# Patient Record
Sex: Female | Born: 1935 | Race: White | Hispanic: No
Health system: Southern US, Community
[De-identification: ages and names within clinical notes are randomized; demographics above are authoritative.]

## PROBLEM LIST (undated history)

## (undated) DIAGNOSIS — I10 Essential (primary) hypertension: Secondary | ICD-10-CM

## (undated) DIAGNOSIS — I639 Cerebral infarction, unspecified: Secondary | ICD-10-CM

## (undated) DIAGNOSIS — M8580 Other specified disorders of bone density and structure, unspecified site: Secondary | ICD-10-CM

## (undated) DIAGNOSIS — E7800 Pure hypercholesterolemia, unspecified: Secondary | ICD-10-CM

## (undated) DIAGNOSIS — F0390 Unspecified dementia without behavioral disturbance: Secondary | ICD-10-CM

## (undated) DIAGNOSIS — N189 Chronic kidney disease, unspecified: Secondary | ICD-10-CM

---

## 2003-01-24 ENCOUNTER — Ambulatory Visit (HOSPITAL_COMMUNITY): Admission: RE | Admit: 2003-01-24 | Discharge: 2003-01-24 | Payer: Self-pay | Admitting: Family Medicine

## 2003-06-24 ENCOUNTER — Inpatient Hospital Stay (HOSPITAL_COMMUNITY): Admission: EM | Admit: 2003-06-24 | Discharge: 2003-06-28 | Payer: Self-pay | Admitting: Emergency Medicine

## 2003-11-22 ENCOUNTER — Ambulatory Visit (HOSPITAL_COMMUNITY): Admission: RE | Admit: 2003-11-22 | Discharge: 2003-11-22 | Payer: Self-pay | Admitting: Family Medicine

## 2004-03-17 ENCOUNTER — Inpatient Hospital Stay (HOSPITAL_COMMUNITY): Admission: EM | Admit: 2004-03-17 | Discharge: 2004-03-21 | Payer: Self-pay | Admitting: Emergency Medicine

## 2004-03-18 ENCOUNTER — Ambulatory Visit: Payer: Self-pay | Admitting: *Deleted

## 2004-03-19 ENCOUNTER — Ambulatory Visit: Payer: Self-pay | Admitting: *Deleted

## 2005-01-13 ENCOUNTER — Ambulatory Visit (HOSPITAL_COMMUNITY): Admission: RE | Admit: 2005-01-13 | Discharge: 2005-01-13 | Payer: Self-pay | Admitting: Family Medicine

## 2005-01-14 ENCOUNTER — Observation Stay (HOSPITAL_COMMUNITY): Admission: EM | Admit: 2005-01-14 | Discharge: 2005-01-18 | Payer: Self-pay | Admitting: Emergency Medicine

## 2005-10-29 ENCOUNTER — Inpatient Hospital Stay (HOSPITAL_COMMUNITY): Admission: AD | Admit: 2005-10-29 | Discharge: 2005-11-01 | Payer: Self-pay | Admitting: Family Medicine

## 2007-09-28 ENCOUNTER — Emergency Department (HOSPITAL_COMMUNITY): Admission: EM | Admit: 2007-09-28 | Discharge: 2007-09-28 | Payer: Self-pay | Admitting: Emergency Medicine

## 2009-10-21 ENCOUNTER — Ambulatory Visit (HOSPITAL_COMMUNITY): Admission: RE | Admit: 2009-10-21 | Discharge: 2009-10-21 | Payer: Self-pay | Admitting: Family Medicine

## 2010-07-14 ENCOUNTER — Other Ambulatory Visit (HOSPITAL_COMMUNITY): Payer: Self-pay | Admitting: Family Medicine

## 2010-07-14 ENCOUNTER — Ambulatory Visit (HOSPITAL_COMMUNITY)
Admission: RE | Admit: 2010-07-14 | Discharge: 2010-07-14 | Disposition: A | Discharge status: Home / Self Care | Payer: Medicare Other | Source: Ambulatory Visit | Attending: Family Medicine | Admitting: Family Medicine

## 2010-07-14 DIAGNOSIS — Z9181 History of falling: Secondary | ICD-10-CM | POA: Insufficient documentation

## 2010-07-14 DIAGNOSIS — R531 Weakness: Secondary | ICD-10-CM

## 2010-07-14 DIAGNOSIS — W19XXXA Unspecified fall, initial encounter: Secondary | ICD-10-CM

## 2010-07-14 DIAGNOSIS — R5381 Other malaise: Secondary | ICD-10-CM | POA: Insufficient documentation

## 2010-07-14 DIAGNOSIS — R4182 Altered mental status, unspecified: Secondary | ICD-10-CM | POA: Insufficient documentation

## 2010-07-14 DIAGNOSIS — H7090 Unspecified mastoiditis, unspecified ear: Secondary | ICD-10-CM | POA: Insufficient documentation

## 2010-07-14 DIAGNOSIS — R5383 Other fatigue: Secondary | ICD-10-CM | POA: Insufficient documentation

## 2010-07-14 DIAGNOSIS — J320 Chronic maxillary sinusitis: Secondary | ICD-10-CM | POA: Insufficient documentation

## 2010-07-15 ENCOUNTER — Other Ambulatory Visit (HOSPITAL_COMMUNITY): Payer: Self-pay | Admitting: Family Medicine

## 2010-07-15 DIAGNOSIS — R531 Weakness: Secondary | ICD-10-CM

## 2010-07-15 DIAGNOSIS — R479 Unspecified speech disturbances: Secondary | ICD-10-CM

## 2010-07-16 ENCOUNTER — Ambulatory Visit (HOSPITAL_COMMUNITY)
Admission: RE | Admit: 2010-07-16 | Discharge: 2010-07-16 | Disposition: A | Discharge status: Home / Self Care | Payer: Medicare Other | Source: Ambulatory Visit | Attending: Family Medicine | Admitting: Family Medicine

## 2010-07-16 DIAGNOSIS — R4182 Altered mental status, unspecified: Secondary | ICD-10-CM | POA: Insufficient documentation

## 2010-07-16 DIAGNOSIS — R479 Unspecified speech disturbances: Secondary | ICD-10-CM

## 2010-07-16 DIAGNOSIS — G939 Disorder of brain, unspecified: Secondary | ICD-10-CM | POA: Insufficient documentation

## 2010-07-16 DIAGNOSIS — M6281 Muscle weakness (generalized): Secondary | ICD-10-CM | POA: Insufficient documentation

## 2010-07-16 DIAGNOSIS — R531 Weakness: Secondary | ICD-10-CM

## 2010-07-21 ENCOUNTER — Other Ambulatory Visit (HOSPITAL_COMMUNITY): Payer: Self-pay | Admitting: Family Medicine

## 2010-07-21 DIAGNOSIS — G459 Transient cerebral ischemic attack, unspecified: Secondary | ICD-10-CM

## 2010-07-23 ENCOUNTER — Ambulatory Visit (HOSPITAL_COMMUNITY)
Admission: RE | Admit: 2010-07-23 | Discharge: 2010-07-23 | Disposition: A | Discharge status: Home / Self Care | Payer: Medicare Other | Source: Ambulatory Visit | Attending: Family Medicine | Admitting: Family Medicine

## 2010-07-23 DIAGNOSIS — I359 Nonrheumatic aortic valve disorder, unspecified: Secondary | ICD-10-CM

## 2010-07-23 DIAGNOSIS — G459 Transient cerebral ischemic attack, unspecified: Secondary | ICD-10-CM | POA: Insufficient documentation

## 2010-07-23 DIAGNOSIS — I10 Essential (primary) hypertension: Secondary | ICD-10-CM | POA: Insufficient documentation

## 2010-07-23 DIAGNOSIS — I6529 Occlusion and stenosis of unspecified carotid artery: Secondary | ICD-10-CM | POA: Insufficient documentation

## 2010-08-07 NOTE — H&P (Signed)
[name], [name]                            ACCOUNT NO.:  [id number]   MEDICAL RECORD NO.:  [id number]                  PATIENT TYPE:   LOCATION:                                       FACILITY:   PHYSICIAN:  Angus G. Renard Matter, M.D.              DATE OF BIRTH:   DATE OF ADMISSION:  DATE OF DISCHARGE:                                HISTORY & PHYSICAL   This patient is 77 years of age.  She was admitted to the ED today with  generalized weakness, difficulty standing and walking.  She was evaluated by  the ED physician.  She had been on Ketac for eye infection.   LAB DATA:  CBC WBC 8700, hemoglobin 13.9, hematocrit 40.5.  Chemistry:  Sodium 137, potassium 4.6, chloride 104, CO2 28, glucose 121, BUN 18,  creatinine 1.2, calcium 8.2.  An electrocardiogram was performed which  showed evidence of a right bundle branch block.  T-wave inversion in lead 3,  AVL and AVF, V3-V6.  The patient's cardiac enzymes CPK total 52, CPK/MB 2.3,  troponin 0.03.   It was felt that the patient should be admitted for monitoring to rule out  ischemic heart disease.   SOCIAL HISTORY:  The patient smokes, does not use drugs or alcohol.   FAMILY HISTORY:  Noncontributory.   PAST MEDICAL HISTORY:  The patient has had previous eye infection, previous  GI bleed due to peptic ulcer disease.   ALLERGIES:  No known allergies.   REVIEW OF SYSTEMS:  HEENT:  Negative except for recent eye infection.  CARDIOPULMONARY:  The patient has intermittent anterior chest pain.  GI:  No  bowel irregularity or bleeding.  GU:  No dysuria or hematuria.   PHYSICAL EXAMINATION:  VITAL SIGNS:  Blood pressure 188/66, respirations 24,  pulse 76, temperature 97.6.  HEENT:  Eyes PERRLA.  TMs negative.  Oropharynx benign.  NECK:  Supple.  No JVD or thyroid abnormalities.  LUNGS:  Clear to P&A.  HEART:  Regular rhythm, no murmurs.  ABDOMEN:  No palpable organs or masses.  SKIN:  Warm and dry.  EXTREMITIES:  Free of edema.   DIAGNOSES:  Generalized weakness, urinary tract infection, abnormal EKG.  Rule out ischemic heart disease.    ___________________________________________                                         Ishmael Holter. Renard Matter, M.D.   AGM/MEDQ  D:  06/24/2003  T:  06/24/2003  Job:  [id number]

## 2010-08-07 NOTE — Group Therapy Note (Signed)
[name], [name]                ACCOUNT NO.:  [id number]   MEDICAL RECORD NO.:  [id number]          PATIENT TYPE:  INP   LOCATION:  A216                          FACILITY:  [facility]   PHYSICIAN:  Angus G. Renard Matter, MD   DATE OF BIRTH:  02/15/1927   DATE OF PROCEDURE:  DATE OF DISCHARGE:                                   PROGRESS NOTE   ADDENDUM:   HISTORY:  This patient has bilateral carotid bruits heard on examination.     Angu   AGM/MEDQ  D:  03/18/2004  T:  03/18/2004  Job:  [id number]

## 2010-08-07 NOTE — Consult Note (Signed)
NAME:  [name], [name] NO.:  [id number]   MEDICAL RECORD NO.:  [id number]                   PATIENT TYPE:  INP   LOCATION:  A206                                 FACILITY:  [facility]   PHYSICIAN:  Vida Roller, M.D.                DATE OF BIRTH:  10/18/1926   DATE OF CONSULTATION:  06/25/2003  DATE OF DISCHARGE:                                   CONSULTATION   CARDIOLOGY CONSULTATION   HISTORY OF PRESENT ILLNESS:  [name] is a woman followed by Dr. Butch Penny who presented with unsteadiness and dizziness associated with some  chest discomfort by history, although she states that she really has not had  any discomfort in her chest or shortness of breath.  She states that  sometimes she gets unsteady on her feet.  She is not a very good historian  and appears to have a moderate amount of dementia.  Her daughter is telling  me that she was quite unsteady on her feet yesterday, but much better today.  She denies any PND or orthopnea.  No lower extremity edema.  No visual  changes although she has recently had an infection in her eye which is being  treated with some topical lotion and the question is whether she has those  visual changes from that.  She denies any heat or cold intolerance.  She has  not lost or gained any significant amount of weight.   PAST MEDICAL HISTORY:  Her past medical history is really pretty much  noncontributory.  She has not really been seen by physician's in the past  for any particular thing other than this infection in her eye which has  occurred and she was seen by Dr. Renard Matter a couple of days ago who treated  her for this particular issue. She denies any high blood pressure, diabetes  mellitus or hypercholesterolemia, though she tells me that she has never had  her cholesterol checked.  She does have a history of peptic ulcer disease.  She was treated a number of years ago, and this was thought to be secondary  to  high-dose aspirin therapy though this is not well documented.   SOCIAL HISTORY:  She smokes cigarettes and has for many years, probably has  COPD.  She does not drink alcohol or use any illicit drugs.   FAMILY HISTORY:  Her family history is essentially unobtainable  She does  not recall how old her parents were or what they died of.  She has no known  drug allergies.   REVIEW OF SYSTEMS:  Her review of systems is generally negative except for  that reviewed in her history of present illness.   PHYSICAL EXAMINATION:  VITAL SIGNS:  When she presented to the ER her blood  pressure was 188/66 in her right arm, respiratory rate was 27, her pulse was  76.  She is  currently down to 96/61 with no therapy.  GENERAL:  She is a think white female in no apparent distress.  MENTAL STATUS:  She is alert and oriented x4.  HEENT:  She has relatively impressive exophthalmos.  NECK:  She does have a mild enlarged thyroid to palpation.  She has  relatively significant bilateral carotid bruits; right is louder than the  left.  CHEST:  She has decreased breath sounds throughout her bases.  No rales are  noted.  CARDIAC:  Her cardiac exam is a nondisplaced point of maximal impulse, no  lifts or thrills.  She has a 2/6 holosystolic murmur.  There are no extra  sounds.  ABDOMEN:  Her abdomen is soft, nontender, normoactive bowel sounds.  EXTREMITIES:  Her pulses are diminished in her lower extremities.  She has  bilateral femoral bruits that are relatively soft.  Her upper extremity  pulses are 1+ to 2+, no obvious bruits.  She does not have any clubbing,  cyanosis, or edema in her lower extremities.  NEUROLOGIC:  Her neurologic exam is nonfocal to my exam.   Chest x-ray shows cardiomegaly without any acute process.  Head CT shows no  acute process with chronic white matter changes.   LABORATORIES:  White blood cell count is 8.7, H&H of 14 and 41.  Her  platelet count is 236, sodium of 137, potassium  4.6, chloride 104, bicarb  28, BUN 18, creatinine 1.2, and her blood glucose is 121.  Three sets of  cardiac enzymes are not consistent with acute myocardial infarction.  Her UA  shows many white blood cells but is otherwise unremarkable.   CURRENT MEDICATIONS:  The only thing that she is on is ciprofloxacin 500 mg  q.8h.   ASSESSMENT:  So, we have a woman with dizziness of unknown etiology.  Her CT  scan is not revealing, but she does have bilateral carotid bruits and one  wonders if she potentially could have had a small transient ischemic attack.  She does have chronic white matter changes and it is possible that these  were not appreciated.  She has an abnormal EKG with a right bundle branch  block of unknown etiology.  This may have been a chronic problem, it does  not appear to be acute, but we have no old EKG changes for comparison.  There is a history of chest discomfort which is not well documented in her  history of present illness.  Her cardiac enzymes are negative x3.  There are  no Beta-Natriuretic peptide drawn, however.   PLAN:  So my plan is to get an echocardiogram to assess the left ventricular  systolic function; carotid Dopplers to assess how severe her carotid disease  is.  I think that we should probably check a Beta-Natriuretic peptide ,  thyroid function studies and a D-dimmer.  If all of these come back  unrevealing, then, I think, an Adenosine Cardiolite is probably a reasonable  test to do.  Obviously if she has severe carotid disease we will cancel the  Adenosine Cardiolite and consider other therapy; and if her echocardiogram  shows depressed LV function we can consider a heart catheterization at that  time.  Further decisions are forthcoming.      ___________________________________________  Vida Roller, M.D.  JH/MEDQ  D:  06/25/2003  T:  06/25/2003  Job:  [id number]

## 2010-08-07 NOTE — Group Therapy Note (Signed)
NAMESECRET, KRISTENSEN                ACCOUNT NO.:  [id number]   MEDICAL RECORD NO.:  [id number]          PATIENT TYPE:  INP   LOCATION:  A216                          FACILITY:  [facility]   PHYSICIAN:  Angus G. Renard Matter, MD   DATE OF BIRTH:  04-Jan-1927   DATE OF PROCEDURE:  DATE OF DISCHARGE:                                   PROGRESS NOTE   This patient was admitted with chest pain.  She was seen by cardiology who  ordered echocardiogram.  Her cardiac enzymes have remained negative.  It was  felt that she did not have any evidence of ischemia.   OBJECTIVE:  VITAL SIGNS:  Blood pressure 160/67; respirations 20; pulse 78;  temperature 97.7.  LUNGS:  Clear to P&A.  HEART:  Regular rhythm.   ASSESSMENT:  Patient was admitted with chest pain, hypotension.   PLAN:  Continue current regimen.     Angu   AGM/MEDQ  D:  03/19/2004  T:  03/19/2004  Job:  [id number]

## 2010-08-07 NOTE — Group Therapy Note (Signed)
NAMEZAKKIYYA, BARNO                ACCOUNT NO.:  [id number]   MEDICAL RECORD NO.:  [id number]          PATIENT TYPE:  INP   LOCATION:  A310                          FACILITY:  [facility]   PHYSICIAN:  Angus G. Renard Matter, MD   DATE OF BIRTH:  [birth year]   DATE OF PROCEDURE:  01/17/2005  DATE OF DISCHARGE:                                   PROGRESS NOTE   SUBJECTIVE:  This patient was admitted with dehydration, prerenal azotemia,  metabolic acidosis, hyperkalemia, possible ischemic heart disease.  The  patient does have anemia with hemoglobin 9.9, hematocrit 28.7.  Most recent  potassium was 5.8, sodium 133, serum iron 90, iron binding capacity 213.  UA  negative.   ASSESSMENT:  The patient was admitted with dehydration, prerenal azotemia,  metabolic acidosis and hyperkalemia.  She does have anemia of undetermined  etiology.  Obtained stool for occult blood.      Angus G. Renard Matter, MD  Electronically Signed     AGM/MEDQ  D:  01/17/2005  T:  01/18/2005  Job:  [id number]

## 2010-08-07 NOTE — H&P (Signed)
[name], [name]                ACCOUNT NO.:  [id number]   MEDICAL RECORD NO.:  [id number]          PATIENT TYPE:  INP   LOCATION:  A202                          FACILITY:  [facility]   PHYSICIAN:  Angus G. Renard Matter, MD   DATE OF BIRTH:  1926/07/30   DATE OF ADMISSION:  10/29/2005  DATE OF DISCHARGE:  LH                                HISTORY & PHYSICAL   HISTORY OF PRESENT ILLNESS:  This 77 year old white female was seen in the  office today with the chief complaint of weakness.  The patient had been  seen yesterday in the office for the evaluation.  She has a history of  azotemia, coronary artery disease, chronic kidney disease, carotid occlusive  disease.  The patient had a basic metabolic panel done yesterday which  showed a sodium of 138, potassium 6.5, chloride 110, CO2 of 18, glucose 99,  BUN 37, creatinine 1.4.  The patient was asked to return for a repeat study  on her electrolytes.  These were repeated today and revealed a sodium of  140, potassium 7, chloride 113, BUN 43, creatinine 1.7.  It was felt that  the patient needs to be in the hospital for the treatment of critical  potassium level.  She is admitted as an observation to address this issue  with IV insulin, Kayexalate, etc and closer monitoring.   SOCIAL HISTORY:  The patient was a former cigarette smoker.   FAMILY HISTORY:  Noncontributory.  No known coronary artery disease,  diabetes, etc.   PAST MEDICAL/SURGICAL HISTORY:  1. The patient has a history of having been hospitalized many years ago      for double pneumonia.  2. More recently has had an evaluation by Dr. Salvadore Farber in      [place] for carotid occlusive disease.  The patient has not been      treated surgically for this, but she has a significant carotid      occlusion on the left.  3. The patient has had six pregnancies.  4. A tubal ligation.  5. Dyslipidemia.  6. Known azotemia with a GFR of 28.37, creatinine of 1.5.   ALLERGIES:  No  known drug allergies.   MEDICATIONS:  1. Lovastatin 20 mg daily.  2. Niaspan 500 mg daily.  3. Hydrochlorothiazide 12.5 mg daily.  4. Nitrostat 0.4 mg p.r.n.  5. Enalapril 10 mg daily.  6. Plavix 75 mg daily.  7. Lorazepam 0.5 mg q.4h. p.r.n.  8. Aspirin 81 mg daily.   REVIEW OF SYSTEMS:  HEENT:  Negative.  CARDIOPULMONARY:  No cough,  hemoptysis or dyspnea.  GI:  No bowel irregularity or bleeding.  GENITOURINARY:  No dysuria or hematuria.   PHYSICAL EXAMINATION:  GENERAL:  Alert patient who is extremely hard of  hearing.  VITAL SIGNS:  Blood pressure 120/80, pulse 60, respirations 18, temperature  98 degrees.  HEENT:  Decreased hearing bilaterally.  Eyes:  Pupils equal, round, reactive  to light and accommodation.  The patient has lid lag on both upper lids.  Oropharynx benign.  NECK:  Supple, no jugular  venous distention.  A carotid bruit on the left.  HEART:  A regular rhythm.  No murmurs, no cardiomegaly.  ABDOMEN:  No palpable organs or masses.  GENITALIA:  Normal.  EXTREMITIES:  Free of edema.  NEUROLOGIC:  No focal deficit.   DIAGNOSES:  1. Hyperkalemia.  2. Azotemia.  3. History of coronary artery disease.  4. Dyslipidemia.  5. History of carotid occlusive disease.   PLAN:  To start intravenous fluids and bolus the patient with 50% glucose  and give 10 units of Regular insulin.  The patient will be started on  Kayexalate p.o. and electrolytes will be closely monitored.  The patient  will be placed on telemetry.      Angus G. Renard Matter, MD  Electronically Signed     AGM/MEDQ  D:  10/29/2005  T:  10/29/2005  Job:  [id number]

## 2010-08-07 NOTE — Group Therapy Note (Signed)
[name], [name]                ACCOUNT NO.:  [id number]   MEDICAL RECORD NO.:  [id number]          PATIENT TYPE:  INP   LOCATION:  A310                          FACILITY:  [facility]   PHYSICIAN:  Angus G. Renard Matter, MD   DATE OF BIRTH:  1926-04-24   DATE OF PROCEDURE:  01/16/2005  DATE OF DISCHARGE:                                   PROGRESS NOTE   This patient was admitted with dehydration, prerenal azotemia, metabolic  acidosis, hyperkalemia, possible ischemic heart disease.  Her most recent  electrolyte shows a sodium of 133, potassium 5.8, chloride 112, BUN 40,  creatinine 1.3.  Cardiac enzymes remain normal.  Her hemoglobin had dropped  to 9.9 with hematocrit 28.7.   OBJECTIVE:  VITAL SIGNS:  Blood pressure 102/62, respirations 20, pulse 88,  temp 97.3.  BUN 40, creatinine 1.3.  HEART:  Regular rhythm.  LUNGS:  Clear to P&A.  ABDOMEN:  No palpable organs or masses.   ASSESSMENT:  The patient admitted with dehydration, prerenal azotemia,  metabolic acidosis, hyperkalemia.  She does have anemia of undetermined  etiology.   PLAN:  Obtain stool cultures.  Repeat CBC.  Anemia profile.      Angus G. Renard Matter, MD  Electronically Signed     AGM/MEDQ  D:  01/16/2005  T:  01/16/2005  Job:  [id number]

## 2010-08-07 NOTE — Group Therapy Note (Signed)
[name]                ACCOUNT NO.:  [id number]   MEDICAL RECORD NO.:  [id number]          PATIENT TYPE:  INP   LOCATION:  A310                          FACILITY:  [facility]   PHYSICIAN:  Angus G. Renard Matter, MD   DATE OF BIRTH:  1926-04-25   DATE OF PROCEDURE:  01/15/2005  DATE OF DISCHARGE:                                   PROGRESS NOTE   SUBJECTIVE:  This patient was admitted through the ED with weakness.  She  was felt to have dehydration, prerenal azotemia, hyperkalemia, metabolic  acidosis, possibly secondary to dehydration.   OBJECTIVE:  VITAL SIGNS:  Blood pressure 153/48, pulse 52, respirations 20.  LUNGS:  Clear to P&A.  HEART:  Regular rhythm.  ABDOMEN:  No palpable organs or masses.   ASSESSMENT:  The patient was admitted with dehydration, prerenal azotemia,  hyperkalemia, possible underlying coronary artery disease.  The patient has  known carotid artery stenosis.   PLAN:  Continue current regimen.      Angus G. Renard Matter, MD  Electronically Signed     AGM/MEDQ  D:  01/15/2005  T:  01/15/2005  Job:  [id number]

## 2010-08-07 NOTE — Group Therapy Note (Signed)
[name], [name]                ACCOUNT NO.:  [id number]   MEDICAL RECORD NO.:  [id number]          PATIENT TYPE:  INP   LOCATION:  A202                          FACILITY:  [facility]   PHYSICIAN:  Angus G. Renard Matter, MD   DATE OF BIRTH:  01-May-1926   DATE OF PROCEDURE:  11/01/2005  DATE OF DISCHARGE:  11/01/2005                                   PROGRESS NOTE   This patient returned through the ED with generalized weakness.  She does  have a history of congestive heart failure, cardiomyopathy, coronary artery  disease, COPD, and recent pneumonia.  Apparently started noticing weakness  on her lower extremities which continued to get worse to the point that she  could not walk, although she can move her extremities.   SIGNIFICANT LABORATORY DATA:  Hemoglobin 9.8, hematocrit 29.2.  Chemistries  were normal.   OBJECTIVE:  VITAL SIGNS:  Blood pressure 132/67, respirations 20, pulse 106,  temperature 97.3.  Hemoglobin 9.8, hematocrit 29.2.  HEART:  Regular rhythm.  LUNGS:  Diminished breath sounds.  ABDOMEN:  No palpable organs or masses.  NEUROLOGIC:  Patient has weakness in extremities.   ASSESSMENT:  Patient was admitted with general weakness in her legs.  Does  have history of congestive heart failure, cardiomyopathy, coronary artery  disease, atrial fibrillation, history of breast cancer, history of elevated  BNP.   PLAN:  Proceed with x-ray of head, CT, neurology consult.      Angus G. Renard Matter, MD  Electronically Signed     AGM/MEDQ  D:  11/01/2005  T:  11/01/2005  Job:  [id number]

## 2010-08-07 NOTE — Group Therapy Note (Signed)
[name], [name]                ACCOUNT NO.:  [id number]   MEDICAL RECORD NO.:  [id number]          PATIENT TYPE:  INP   LOCATION:  A216                          FACILITY:  [facility]   PHYSICIAN:  Angus G. Renard Matter, MD   DATE OF BIRTH:  [birth year]   DATE OF PROCEDURE:  DATE OF DISCHARGE:                                   PROGRESS NOTE   This patient was admitted with anterior chest pain, shortness of breath and  hypotension.  Her blood pressure still remains low at 84/50 with pulse rate  of 70.  Patient has had no further chest pain.  Her cardiac enzyme markers  remain normal.  Arterial blood gases remain in normal range.   OBJECTIVE:  VITAL SIGNS:  Blood pressure 84/50; respirations 18; pulse 70.  LUNGS:  Diminished breath sounds.  HEART:  Regular rhythm.  ABDOMEN:  No palpable organs or masses.   ASSESSMENT:  Patient was admitted to the hospital with hypotension,  bradycardia, chest pain.   PLAN:  Obtain cardiology consult, continue current regimen.     Angu   AGM/MEDQ  D:  03/18/2004  T:  03/18/2004  Job:  [id number]

## 2010-08-07 NOTE — Discharge Summary (Signed)
[name], [name]                ACCOUNT NO.:  [id number]   MEDICAL RECORD NO.:  [id number]          PATIENT TYPE:  INP   LOCATION:  A216                          FACILITY:  [facility]   PHYSICIAN:  Angus G. Renard Matter, MD   DATE OF BIRTH:  1926/06/13   DATE OF ADMISSION:  03/17/2004  DATE OF DISCHARGE:  12/31/2005LH                                 DISCHARGE SUMMARY   PATIENT PROFILE:  [age]-year-old white female admitted March 17, 2004, discharged March 21, 2004, 4 days' hospitalization.   DIAGNOSES:  1.  Hypotension.  2.  Bradycardia.  3.  Chest pain.  4.  Possibly underlying peptic ulcer disease.   CONDITION ON DISCHARGE:  Patient's condition stable at the time of her  discharge.   HISTORY OF PRESENT ILLNESS:  [age]-year-old white female was admitted  through the emergency department of the hospital; she presented there with  shortness of breath and anterior chest pain.  The patient does have a  history of chronic obstructive pulmonary disease.  The patient was thought  to be hypotensive with blood pressure of 94/60 and the emergency department  physician was reluctant to send the patient home with this finding; the  patient was therefore admitted.   EXAMINATION:  GENERAL:  Alert female.  VITAL SIGNS:  Blood pressure 94/60, pulse 61, respirations 18.  HEENT:  Decreased vision.  NECK:  Supple.  Bilateral carotid bruits.  LUNGS:  Diminished breath sounds.  HEART:  Regular rhythm, no murmurs.  ABDOMEN:  No palpable organs or masses.  SKIN:  Skin warm and dry.  EXTREMITIES:  Extremities clear of edema.  NEUROLOGICAL:  No focal deficit.   LABORATORY DATA:  Admission CBC:  WBC 6600, hemoglobin 11.7, hematocrit  34.2.  Blood gases on admission:  A pH 7.364 with a PCO2 of 43.1 and PO2 of  121.0.  Chemistries:  Sodium 132, potassium 4.1, chloride 100, CO2 25,  glucose 118, BUN 22, creatinine 1.2, calcium 8.5.  Subsequent chemistries on  March 21, 2004:  Sodium  135, potassium 3.9, chloride 108, CO2 22, glucose  111, BUN 16, creatinine 1, calcium 8.1.  Liver enzymes:  SGOT 22, SGPT 15,  alkaline phosphatase 57.  CK on admission 79, CK-MB 2.5, relative index  0.01; subsequent CK 82, CK-MB 2.4, troponin 0.01.   Chest x-ray:  Chronic interstitial changes, no acute abnormality.   Electrocardiogram:  Normal sinus rhythm with a rate of 80, anterolateral T  wave abnormalities.   Echocardiogram:  Left ventricle of normal size, normal ejection fraction of  55% to 60%, no wall motion abnormalities, sclerotic aortic valve, mild  tricuspid regurgitation.   HOSPITAL COURSE:  The patient, at the time of her admission, was placed on a  low-cholesterol diet, bedrest, nasal O2 at 2 L.  Vital signs were monitored.  The patient was given nitroglycerin 0.4 mg q.5 min. p.r.n.  She was  continued on Plavix 75 mg daily, Lovenox 40 mg subcu daily, Advicor 500/20  mg 1 at bedtime.  The patient was seen by cardiology.  Echocardiogram was  done for LV function and this  was normal, no wall motion abnormalities.  She  was placed on Altace 2.5 mg daily.  The etiology of her chest pain was  unclear and was felt possibly secondary to underlying peptic ulcer disease.  It was felt she did not have any ischemic heart disease of note.  The  patient showed a progressive improvement during her hospital stay and was  able to be discharged on the 4th hospital day.   DISCHARGE MEDICATIONS:  The patient was discharged on:  1.  Plavix 75 mg daily.  2.  Zocor 10 mg daily.  3.  Niaspan 500 mg at h.s.  4.  Altace 2.5 mg daily.  5.  Nitrostat 0.4 mg p.r.n.     Angu   AGM/MEDQ  D:  04/06/2004  T:  04/06/2004  Job:  [id number]

## 2010-08-07 NOTE — H&P (Signed)
[name], [name]                ACCOUNT NO.:  [id number]   MEDICAL RECORD NO.:  [id number]          PATIENT TYPE:  INP   LOCATION:  A310                          FACILITY:  [facility]   PHYSICIAN:  Angus G. Renard Matter, MD   DATE OF BIRTH:  26-Aug-1926   DATE OF ADMISSION:  01/14/2005  DATE OF DISCHARGE:  LH                                HISTORY & PHYSICAL   HISTORY OF PRESENT ILLNESS:  This patient is a 77 year old white female who  presented to the  ED with decreased mental status. Apparently, she had  shaking spell, and according to the family members was not acting like  herself.  She was evaluated by ED physician.   LABORATORY DATA:  CBC:  WBC 9500, hemoglobin 11.3, hematocrit 33%.  Chemistry sodium 144, potassium 5.4, chloride 116, CO2 19, glucose 157, BUN  38, creatinine 1.7, calcium 9.2, total protein 7.2, albumin 4.1, SGOT 30,  SGPT 18, alk-phos 70, bilirubin 0.4.  Urinalysis negative.  ABG:  pH 7.289,  PCO2 30.9, PO2 90, bicarbonate 14.4.  Chest x-ray:  No acute disease.  EKG  normal sinus rhythm, T wave abnormality.  Lead 2 and 3, AVF.  The patient  was felt to be in metabolic acidosis with dehydration, hyperkalemia.  She  was started on intravenous fluids and subsequently was admitted.   FAMILY HISTORY:  See previous record.   SOCIAL HISTORY:  The patient does not smoke or drink alcohol.   PAST MEDICAL HISTORY:  1.  History of carotid occlusive disease.  2.  Decreased mentation.  3.  Dyslipidemia.   MEDICATIONS:  1.  Hydrochlorothiazide 12.5 mg.  2.  Vasotec 10 mg.  3.  Nitroglycerin 0.4 mg p.r.n.  4.  Mevacor 20 mg daily.  5.  Niaspan 500 mg daily.   ALLERGIES:  No known drug allergies.   REVIEW OF SYSTEMS:  HEENT:  Negative.  CARDIOPULMONARY:  No cough,  hemoptysis, dyspnea.  GI:  No bowel irregularity or bleeding.  GU:  No  dysuria or hematuria.   PHYSICAL EXAMINATION:  GENERAL APPEARANCE:  Alert female with blood pressure  158/48, pulse 63, respirations 20,  temperature 97.7.  HEENT:  Eyes:  PERRLA.  TM negative.  Oropharynx benign.  NECK:  Supple.  No JVD or thyroid abnormalities.  LUNGS:  Clear to P&A.  HEART:  Regular rhythm.  ABDOMEN:  No palpable organs or masses.  SKIN:  Warm and dry.  EXTREMITIES:  Free of edema.  NEUROLOGICAL:  No focal deficits.  Cranial nerves intact.   DIAGNOSES:  1.  Dehydration.  2.  Prerenal azotemia.  3.  Metabolic acidosis.  4.  Hyperkalemia.  5.  Possible ischemic heart disease      Angus G. Renard Matter, MD  Electronically Signed     AGM/MEDQ  D:  01/15/2005  T:  01/15/2005  Job:  [id number]

## 2010-08-07 NOTE — H&P (Signed)
[name], [name]                ACCOUNT NO.:  [id number]   MEDICAL RECORD NO.:  [id number]          PATIENT TYPE:  INP   LOCATION:  A216                          FACILITY:  [facility]   PHYSICIAN:  Angus G. Renard Matter, MD   DATE OF BIRTH:  1926-12-15   DATE OF ADMISSION:  03/17/2004  DATE OF DISCHARGE:  LH                                HISTORY & PHYSICAL   HISTORY OF PRESENT ILLNESS:  This 77 year old white female was seen in the  emergency department by emergency department physician. She had presented  there with shortness of breath and anterior chest pain. She was evaluated by  emergency department physician. The patient does have a history of chronic  obstructive pulmonary disease. Cardiac monitors were performed in the  emergency department. We communicated with the emergency department  physician. It was felt that the patient was hypotensive with a blood  pressure of 94/60 and emergency department physician was reluctant to send  the patient home with this finding. The patient was therefore admitted.   LABORATORY DATA:  CBC:  White blood cell count 6,600. Hemoglobin 11.7,  hematocrit 34.2. Chemistry:  Sodium 132, potassium 4.1, chloride 100, CO2  25, glucose 118, BUN 22, creatinine 1.2, calcium 8.5. SGOT 22, SGPT 15,  alkaline phosphatase 57. Cardiac monitors:  CK MB 2.6, troponin 0.05,  myoglobin 117. Subsequent myoglobin 106, CPK MB 1.5, troponin 0.05. BNP was  145.   Chest x-ray showed cardiomegaly, chronic interstitial changes.   Electrocardiogram showed incomplete right bundle branch block. This was  stable electrocardiogram when compared to previous electrocardiogram.   SOCIAL HISTORY:  The patient does not smoke or drink.   FAMILY HISTORY:  Positive for coronary artery disease.   PAST MEDICAL/SURGICAL HISTORY:  The patient has a history of hypertension,  history of coronary artery disease, carotid occlusive disease on the left  without neurological symptoms. There is a  prior history of gastrointestinal  bleed from duodenal ulcer. She has a history of hypertension and  dyslipidemia. She has had chronic hearing loss and decreased vision. Has had  cataract removed from the right eye.   ADMISSION MEDICATIONS:  Plavix 75 mg, Altace 2.5 mg, hydrochlorothiazide  12.5 mg, Advicor 520.   ALLERGIES:  ASPIRIN.   REVIEW OF SYSTEMS:  HEENT:  Negative. CARDIOPULMONARY:  The patient has had  shortness of breath and anterior chest pain, which occurred approximately 2  hours prior to admission. GASTROINTESTINAL:  No nausea, vomiting or  diarrhea. GENITOURINARY:  No dysuria or hematuria.   PHYSICAL EXAMINATION:  GENERAL:  An alert female.  VITAL SIGNS:  Blood pressure 94/60, pulse 61, respiratory rate 18.  HEENT:  Decreased vision. Oropharynx benign.  NECK:  Supple.  LUNGS:  Diminished breath sounds.  HEART:  Regular rhythm. No murmurs.  ABDOMEN:  No palpable organs or masses.  SKIN:  Warm and dry.  EXTREMITIES: No edema.  NEUROLOGIC:  No focal deficits.   DIAGNOSES:  1.  Anterior chest pain of undetermined etiology.  2.  History of chronic obstructive pulmonary disease.  3.  Hypotension.     Angu  AGM/MEDQ  D:  03/17/2004  T:  03/17/2004  Job:  [id number]

## 2010-08-07 NOTE — Group Therapy Note (Signed)
NAMEELLYSE, ROTOLO                ACCOUNT NO.:  [id number]   MEDICAL RECORD NO.:  [id number]          PATIENT TYPE:  INP   LOCATION:  A216                          FACILITY:  [facility]   PHYSICIAN:  Angus G. Renard Matter, MD   DATE OF BIRTH:  20-Mar-1927   DATE OF PROCEDURE:  03/20/2004  DATE OF DISCHARGE:                                   PROGRESS NOTE   SUBJECTIVE:  This patient complains of back pain and chest pain.  She  remains stable otherwise.  She was admitted to the hospital with anterior  chest pain.  Has a history of chronic obstructive pulmonary disease and  hypotension.  Cardiology has done 2-D echo and report is pending.   PHYSICAL EXAMINATION:  VITAL SIGNS:  Blood pressure 110/59, respirations 20,  pulse 77, temperature 98.4.  LUNGS: Clear to percussion and auscultation.  HEART:  Regular rhythm.  ABDOMEN:  No palpable organs or masses.   ASSESSMENT:  This patient admitted with chest pain.  Does have chronic  obstructive pulmonary disease.  Recent hypotension.  Her cardiac enzymes  remain normal.  Plan to continue current regimen.     Angu   AGM/MEDQ  D:  03/20/2004  T:  03/20/2004  Job:  [id number]   cc:   Rhae Lerner. Margretta Ditty, M.D.  [address]  [place]  [place] [address]

## 2010-08-07 NOTE — Group Therapy Note (Signed)
NAME:  [name], [name]                          ACCOUNT NO.:  [id number]   MEDICAL RECORD NO.:  [id number]                   PATIENT TYPE:  INP   LOCATION:  A206                                 FACILITY:  [facility]   PHYSICIAN:  Angus G. Renard Matter, M.D.              DATE OF BIRTH:  October 20, 1926   DATE OF PROCEDURE:  DATE OF DISCHARGE:                                   PROGRESS NOTE   SUBJECTIVE:  This patient was admitted through the emergency department with  abnormal EKG and urinary tract infection.  Her cardiac enzymes showed CPK of  59, CK-MB of 2.7, troponin 0.01.   OBJECTIVE:  Vital signs:  Blood pressure 163/61, respirations 22, pulse 84,  temperature 97.4.  Lungs clear to P&A.  Heart, regular rhythm.  Abdomen:  No  palpable organs with masses.   ASSESSMENT:  The patient have a urinary tract infection.  She has an  abnormal EKG and possible underlying ischemic heart disease.   PLAN:  Continue antibiotics.  Will obtain cardiology consultation today.      ___________________________________________                                            Ishmael Holter. Renard Matter, M.D.   AGM/MEDQ  D:  06/25/2003  T:  06/25/2003  Job:  [id number]

## 2010-08-07 NOTE — Procedures (Signed)
NAMESHAKENYA, STONEBERG                ACCOUNT NO.:  [id number]   MEDICAL RECORD NO.:  [id number]          PATIENT TYPE:  INP   LOCATION:  A216                          FACILITY:  [facility]   PHYSICIAN:  Vida Roller, M.D.   DATE OF BIRTH:  [birth year]   DATE OF PROCEDURE:  03/19/2004  DATE OF DISCHARGE:                                  ECHOCARDIOGRAM   PRIMARY CARE PHYSICIAN:  Angus G. Renard Matter, M.D.   Edwyna Shell:  [id number] and [id number] through [id number].   PROCEDURE:  Echocardiogram.   CARDIOLOGIST:  Vida Roller, M.D.   INDICATIONS FOR PROCEDURE:  This is a 77 year old woman with hypotension.   DESCRIPTION OF PROCEDURE:  The technical quality of the study is adequate.   M-MODE:  The aorta is 26 mm.   The left atrium is 41 mm.   The septum is 17 mm.   The posterior wall is 13 mm.   Left ventricular diastolic dimension is 43 mm.   The left ventricular systolic dimension is 31 mm.   2-D AND DOPPLER IMAGING:  The left ventricle is normal size with mild concentric left ventricular  hypertrophy.  There is a normal ejection fraction at 55%-60%.  There are no  wall motion abnormalities.   The right ventricle is of normal size with mild free-wall hypertrophy.  There are no wall motion abnormalities.   There is biatrial enlargement.   The aortic valve is sclerotic, with evidence of mild aortic stenosis, with a  peak gradient of 27 mmHg and moderate aortic insufficiency.   There is moderate mitral annular calcification with mild mitral  regurgitation.  No mitral stenosis.   There is mild tricuspid regurgitation.   The pericardium has a small anterior fusion, versus a fat pad.  It is  difficult to assess.  There is no evidence of cardiac tamponade physiology.   The inferior vena cava is normal size.   The ascending aorta is not well seen.     Trey Paula   JH/MEDQ  D:  03/19/2004  T:  03/19/2004  Job:  [id number]

## 2010-08-07 NOTE — Consult Note (Signed)
[name], [name]                ACCOUNT NO.:  [id number]   MEDICAL RECORD NO.:  [id number]          PATIENT TYPE:  INP   LOCATION:  A202                          FACILITY:  [facility]   PHYSICIAN:  Jorja Loa, M.D.DATE OF BIRTH:  10-Sep-1926   DATE OF CONSULTATION:  10/29/2005  DATE OF DISCHARGE:                                   CONSULTATION   REASON FOR CONSULTATION:  Hyperkalemia.   HISTORY:  [name] is a 77 year old female with a past medical history of  hypertension, a history also of chronic renal insufficiency.  History of  carotid artery disease.  Presently was brought after she was found to have  elevated potassium. Since the patient has severe hearing problem  __________  for admission was obtained from her daughter.  At this moment there is no  history of diabetes, and also no history of nausea or vomiting.   PAST MEDICAL HISTORY:  She has, as stated above, a history of chronic renal  failure with creatinine around 1.78, history of peptic ulcer disease,  history of hypertension, history of peripheral vascular disease.  Also  history of chronic renal failure, and hypercholesterolemia.  She also has  some previous hyperkalemia according to her brother.   MEDICATIONS:  1. As an outpatient consists of ACE inhibitors.  2. Plavix 75 mg p.o. daily.  3. Niacin 500 mg p.o. q.h.s.  4. Zocor 10 mg p.o. daily.  5. She is getting IV fluid at 30 mL per hour.   ALLERGIES:  She is allergic to ASPIRIN.   SOCIAL HISTORY:  No history of smoking.  No history of alcohol abuse.   REVIEW OF SYSTEMS:  No nausea, no vomiting.  Appetite is reasonable. No  shortness of breath, dizziness, or lightheadedness.  At this moment the  patient does not seem to offer any complaints and no swelling of her legs.   PHYSICAL EXAMINATION:  VITAL SIGNS:  Her temperature is 97.9.  Pulse is 20.  Blood pressure is 140/64.  CHEST:  Clear to auscultation.  HEART EXAM:  Revealed regular rate and rhythm.   No murmur.  ABDOMEN:  Soft, positive bowel sounds.  EXTREMITIES:  No edema.   BLOOD WORK:  Her hemoglobin is 10.7, hematocrit 31.5, white blood cell count  is 8.2, sodium 131, potassium 6.1, BUN 42, creatinine 1.8, calcium is 8.5.   HOSPITAL COURSE BY PROBLEMS:  Problem #1:  HYPERKALEMIA.  At this moment  etiology is not clear, but at this moment, since the patient has some mild  renal failure it seems also she was on ACE inhibitor, probably in  combination of also a high potassium diet.  Her potassium, at this moment,  seems to be somewhat lower.   Problem #2:  HYPERTENSION.  Her blood pressure seemed to be controlled very  well.   Problem #3:  HISTORY OF CAROTID ARTERY DISEASE.  She is on Plavix.   Problem #4:  DYSLIPIDEMIA.  She is on cholesterol reducing medication,  Zocor.   Problem #5:  MILD RENAL INSUFFICIENCY, etiology not clear.  Hypertension,  nephrosclerosis and possible ischemic kidney  disease also needs to be  entertained.   RECOMMENDATIONS:  We will start her on normal saline at 80 mL/h.  We will  give her some Kayexalate and will do an ultrasound of the kidneys.  We will  give her Kayexalate 30 gm 2 doses, will coordinate with other medications  and will follow the patient.      Jorja Loa, M.D.  Electronically Signed     BB/MEDQ  D:  10/29/2005  T:  10/30/2005  Job:  [id number]

## 2010-08-07 NOTE — Consult Note (Signed)
NAMENATHALYA, [name]                ACCOUNT NO.:  [id number]   MEDICAL RECORD NO.:  [id number]          PATIENT TYPE:  INP   LOCATION:  A216                          FACILITY:  [facility]   PHYSICIAN:  Vida Roller, M.D.   DATE OF BIRTH:  [birth year]   DATE OF CONSULTATION:  03/18/2004  DATE OF DISCHARGE:                                   CONSULTATION   HISTORY OF PRESENT ILLNESS:  Ms. Friedli is a 77 year old woman that we saw  back in April of this year for chest discomfort.  She had a negative  ischemic workup and was lost to follow-up.  She does have severe carotid  disease, but felt to be non-obstructive.  Also had an echocardiogram then  which was unrevealing.  She came into the hospital because she was having  difficulty walking.  There is some question of a presyncopal episode.  She  was significantly hypotensive in the ER.  It is unclear whether or not she  had any discomfort in her chest.  It is reported on her history and physical  that she did.  She denies this, but states that she can have the chest pain  almost all the time.  She is a very difficult historian, secondary to  deafness and you essentially have to stand and yell into her right ear for  her to hear you.   MEDICATIONS PRIOR TO ADMISSION:  1.  Plavix 75 mg a day.  2.  Altace 2.5 mg a day.  3.  Hydrochlorothiazide 12.5 mg a day.  4.  Avacor 520 once a day.   In the hospital she is on:  1.  Plavix.  2.  Lovenox 40 mg once a day.  3.  Niacin 500 mg q.h.s.  4.  Zocor 10 mg q.h.s.  5.  IV normal saline.   PAST MEDICAL HISTORY:  1.  Peptic ulcer disease.  2.  Peripheral vascular disease.  3.  Hypertension.   STUDIES:  As previously shown.  She had an adenosine Cardiolite done in  April of this year, which showed no evidence of ischemia or scar and  ejection fraction of 62%.   SOCIAL HISTORY:  She lives in [place].  I think she lives by herself.  It  says she lives with her family, but she tells me she  lives by herself.  She  is a widow.  She has seven children.  She used to smoke, but quit two months  ago; has a 50+ pack-year smoking history.  Does not drink or use illicit  drugs.   FAMILY HISTORY:  Noncontributory.   REVIEW OF SYSTEMS:  Difficult to obtain due to her deafness, but it appears  to be generally negative.   PHYSICAL EXAMINATION:  GENERAL:  She is an elderly, mildly cachectic, white  female in no apparent distress who is alert and oriented times two.  She is  not entirely sure what the date is.  VITAL SIGNS:  She is afebrile; her pulse is 70; her respiratory rate is 12;  and her blood pressure is 100/55.  HEAD, EARS,  EYES, NOSE AND THROAT:  Significant for severe left cataract.  She has bilateral bruits, but no significant jugulovenous distension.  CHEST:  Clear to auscultation bilaterally.  CARDIAC:  Regular.  She has a 2/6 holosystolic murmur.  ABDOMEN:  Soft, nontender.  LOWER EXTREMITIES:  Without significant clubbing, cyanosis or edema.  Her  pulses are intact bilaterally.  MUSCULOSKELETAL:  Without significant deformities.  NEUROLOGIC:  Generally negative, except for the mild confusion.   Chest x-ray shows cardiomegaly with chronic interstitial changes, but no  acute changes, compared to a previous chest x-ray.  Electrocardiogram shows  sinus rhythm, but a rate of 60 with left axis deviation.  PR interval is  normal.  Her QRS duration is mildly prolonged at 120 ms.  QT interval is  normal.  She has an incomplete right bundle branch block with inverted T  waves.  This is unchanged from her previous EKG from April of this year.  Sodium 132, potassium 4.1, chloride of 100, bicarbonate 25, BUN 22,  creatinine 1.2 and her blood sugar is 118.  White blood cell count 6.6, H&H  of 11 and 34 with a platelet count of 288.  She is allergic to ASPIRIN.  Her  cardiac enzymes times three are negative.  D-Dimer is 0.75 and her B-type  natriuretic peptide is 145.   So this  is a woman with hypotension, which I think is probably failure to  thrive.  She may be over-medicated.  She may be dehydrated.  It is difficult  to know.  There are no orthostatic vital signs, but she certainly came in at  60/30 when she was evaluated initially in the ER.  This chest pain history  is difficult to obtain.  She has had a relative thorough workup in the past  without any evidence of significant ischemia and the peptic ulcer disease  may be the cause of this and may need to be evaluated.  Certainly, the  anemia needs to be looked at.  The peripheral vascular disease, I think at  this point, is likely to be stable.  Overall, I think probably the best  thing to do is to continue to orally rehydrate her, hold her hypertensives,  I think we will check an echocardiogram to make sure.  Her cardiac enzymes  have been negative times three and she may benefit from placement in a  nursing facility, as it appears that she is not entirely able to care for  herself at home.  This I will leave to her primary care Sitlaly Gudiel.     Trey Paula   JH/MEDQ  D:  03/18/2004  T:  03/18/2004  Job:  [id number]

## 2010-08-07 NOTE — Discharge Summary (Signed)
NAMELIYANA, SUNIGA                ACCOUNT NO.:  [id number]   MEDICAL RECORD NO.:  [id number]          PATIENT TYPE:  INP   LOCATION:  A202                          FACILITY:  [facility]   PHYSICIAN:  Angus G. Renard Matter, MD   DATE OF BIRTH:  Nov 16, 1926   DATE OF ADMISSION:  10/29/2005  DATE OF DISCHARGE:  08/13/2007LH                                 DISCHARGE SUMMARY   A 77 year old female admitted October 29, 2005 and discharged November 01, 2005  for three days hospitalization.   DIAGNOSES:  1. Hyperkalemia.  2. Azotemia.  3. History of coronary artery disease.  4. Dyslipidemia.  5. History of carotid occlusive disease.   CONDITION ON DISCHARGE:  Condition improved at the time of her discharge.   This 77 year old white female was seen in the office on the day of admission  with chief complaint of weakness. She had been seen one day prior for  evaluation. She does have a history of azotemia, coronary artery disease,  chronic kidney disease, carotid occlusive disease. The patient had basic  metabolic panel done which showed sodium 138, potassium 6.5, chloride 110,  CO2 18, glucose 99, BUN 37, creatinine 1.4. The patient was asked to return  and repeat sodium and electrolytes. They were reviewed and revealed sodium  140, potassium 7, chloride 113, BUN 43, creatinine 1.7. It was felt the  patient needed to be in the hospital with critical potassium level. She was  admitted as an observation patient to address issues with IV insulin,  Kayexalate, etc., and closer monitoring.   PHYSICAL EXAMINATION:  GENERAL:  The patient alert, extremely hard of  hearing.  VITAL SIGNS:  Blood pressure 120/80, pulse 60, respiration 18, temperature  98.  HEENT: The patient has decreased hearing bilaterally. Eyes:  Pupils round,  reactive and equal to light and accommodation. The patient has lid lag on  both upper lids. Oropharynx benign.  NECK:  Supple. No JVD, carotid bruit was heard on the left.  HEART:   Regular rhythm. No murmurs, no cardiomegaly.  ABDOMEN:  No palpable organs or masses.  GENITALIA:  Normal.  EXTREMITIES:  Free of edema.  NEUROLOGIC:  No focal deficits.   LABORATORY DATA:  CBC on admission: WBC 8200 with hemoglobin 10.7,  hematocrit 31.5. Chemistries:  Sodium 139 potassium 6.1, chloride 114, CO2  21, glucose 100, BUN 42, creatinine 1.8. Calcium 8.5. Total protein 6.5.  Albumin 3.8. Subsequent chemistries on November 01, 2005:  Sodium 144,  potassium 4.2, chloride 115, CO2 22, glucose 101, BUN 29, creatinine 1.3.  Calcium 8.1. Liver enzymes, SGOT 26, SGPT 18. Alkaline phosphatase 66,  bilirubin 0.6. Magnesium 2. Iron level 49, iron saturation 16.   Renal and urinary tract ultrasound showed small kidneys with mild cortical  thinning, no hydronephrosis. Electrocardiogram:  Normal sinus rhythm,  borderline ST elevation.   HOSPITAL COURSE:  The patient at the time of admission placed on 1/2 saline  KVO rate. She was given 50% glucose 50 cc as a bolus followed by 10 units of  Regular insulin. Her electrolytes were monitored. We asked that she  be seen  by nephrology as a consult. She is continued on her home meds. Lovastatin 20  mg daily, Niaspan 500 mg daily, Plavix 75 mg daily. The patient's potassium  level was improved. She did have a renal ultrasound which showed small  kidneys but no hydronephrosis. She was started by __________  on Lasix 60 mg  b.i.d. Foley catheter was inserted. Her Lasix subsequently was changed to 60  mg daily. The patient did improve, became more alert. There is some question  whether or not the patient's ACE inhibitor may have contributed to her  hyperkalemia. This was held during her hospitalization. Her electrolytes  returned to normal, and she was able to discharged on the third hospital  day. She was sent home on Lovastatin 20 mg daily, Niaspan 500 mg daily, HCTZ  12.5 mg daily, p.r.n. Nitrostat. Enalapril was held. Plavix 75 mg daily,   aspirin 81 mg daily, lorazepam 0.5 mg p.r.n.      Angus G. Renard Matter, MD  Electronically Signed     AGM/MEDQ  D:  11/16/2005  T:  11/16/2005  Job:  [id number]

## 2010-08-07 NOTE — Group Therapy Note (Signed)
[name], [name]                ACCOUNT NO.:  [id number]   MEDICAL RECORD NO.:  [id number]          PATIENT TYPE:  INP   LOCATION:  A202                          FACILITY:  [facility]   PHYSICIAN:  Angus G. Renard Matter, MD   DATE OF BIRTH:  Mar 13, 1927   DATE OF PROCEDURE:  10/30/2005  DATE OF DISCHARGE:                                   PROGRESS NOTE   PROGRESS NOTE - October 30, 2005.   SUBJECTIVE:  This patient was admitted because of elevated potassium  associated with azotemia.  The patient has received Kayexalate and is  feeling much better.  She was having weakness, generalized at the time of  her admission.  Most recent BMET shows sodium 145, potassium 5.1, chloride  114, CO2 22, glucose 96, BUN 44, creatinine 1.8.  Magnesium was 2.0.   OBJECTIVE:  VITAL SIGNS:  Blood pressure 140/58, respirations 21, pulse 86,  temperature 97.4.  HEART:  Regular rhythm.  LUNGS:  Clear.  ABDOMEN: No palpable organs or masses.   ASSESSMENT:  Patient has renal insufficiency, hyperkalemia which has  improved.   PLAN:  To continue to monitor her electrolytes.  Ultrasound has been ordered  of the kidneys.  Foley catheter inserted.  She received Kayexalate 30 grams.  She has been seen by nephrology.      Angus G. Renard Matter, MD  Electronically Signed     AGM/MEDQ  D:  10/30/2005  T:  10/30/2005  Job:  [id number]

## 2010-08-07 NOTE — Procedures (Signed)
NAME:  [name], [name]                          ACCOUNT NO.:  [id number]   MEDICAL RECORD NO.:  [id number]                   PATIENT TYPE:  INP   LOCATION:  A206                                 FACILITY:  [facility]   PHYSICIAN:  Vida Roller, M.D.                DATE OF BIRTH:  1926-11-25   DATE OF PROCEDURE:  DATE OF DISCHARGE:                                  ECHOCARDIOGRAM   TAPE NUMBER:  LB [id number], tape count 3075 through 3689.   INDICATION:  This is a woman with dizziness and abnormal EKG.  No previous  echocardiograms.   TECHNICAL QUALITY:  Adequate.   M-MODE TRACINGS:  1. The aorta is 27 mm.  2. The left atrium is 39 mm.  3. The septum is 18 mm.  4. The posterior wall is 13 mm.  5. The left ventricular diastolic dimension is 42 mm.  6. The left ventricular systolic dimension is 29 mm.   2-D AND DOPPLER IMAGING:  1. The left ventricle is normal size with normal systolic function.  There     is moderate left ventricular hypertrophy with predominance in the septum     consistent with the patient's advanced age.  There appears to be a small     left ventricular outflow gradient which is unclear as to whether or not     it is from the upper septal hypertrophy or from the valve, but it is only     about 17 mmHg.  2. The estimated ejection fraction is 60-65%.  There is a mild left     ventricular relaxation abnormality consistent with the patient's age.  3. The right ventricle is normal size with normal systolic function.  4. Both atria are normal size.  There is no obvious atrial septal defect.  5. There is mild insufficiency of the aortic valve.  The left ventricular     outflow gradient has previously been discussed.  6. The mitral valve is morphologically unremarkable with mild annular     calcification.  There is trace to mild insufficiency.  7. The tricuspid valve has mild insufficiency.  8. The pulmonic valve is not well seen.  9. There is a small echo-free space in the  pericardium which is probably     pericardial fat versus and insignificant pericardial effusion.  10.      The inferior vena cava is normal size.  11.      The ascending aorta appears to have mild atherosclerosis.  The     aortic arch also has significant atherosclerosis and was not well seen.      ___________________________________________                                            Vida Roller, M.D.   JH/MEDQ  D:  06/25/2003  T:  06/26/2003  Job:  [id number]

## 2010-08-07 NOTE — Group Therapy Note (Signed)
[name], [name]                ACCOUNT NO.:  [id number]   MEDICAL RECORD NO.:  [id number]          PATIENT TYPE:  INP   LOCATION:  A202                          FACILITY:  [facility]   PHYSICIAN:  Angus G. Renard Matter, MD   DATE OF BIRTH:  03-May-1926   DATE OF PROCEDURE:  DATE OF DISCHARGE:                                   PROGRESS NOTE   This patient was admitted because of elevated potassium associated with  azotemia.  She has received Kayexalate and continues to feel better.  Her  current lab studies show sodium 142, potassium 5.1, chloride 113, CO2 24,  glucose 102, BUN 41 and creatinine 2.  The patient had an ultrasound of  kidney's which did not show evidence of hydronephrosis.   OBJECTIVE:  VITAL SIGNS:  Blood pressure 138/70, respiration 20, pulse 79,  temperature 97.9.  LUNGS:  Clear.  HEART:  Regular rhythm.  ABDOMEN:  No palpable organs or masses but she has had some diarrhea  intermittently.   ASSESSMENT:  Patient has chronic renal failure, history of hypertension,  hyperkalemia which has improved.   PLAN:  To continue current regimen.  Will continue to follow the patient's  electrolytes, BUN, creatinine and potassium.      Angus G. Renard Matter, MD  Electronically Signed     AGM/MEDQ  D:  10/31/2005  T:  11/01/2005  Job:  [id number]

## 2010-08-07 NOTE — Discharge Summary (Signed)
NAME:  [name], [name]                          ACCOUNT NO.:  [id number]   MEDICAL RECORD NO.:  [id number]                   PATIENT TYPE:  INP   LOCATION:  A206                                 FACILITY:  [facility]   PHYSICIAN:  Angus G. Renard Matter, M.D.              DATE OF BIRTH:  [birth year]   DATE OF ADMISSION:  06/24/2003  DATE OF DISCHARGE:  06/28/2003                                 DISCHARGE SUMMARY   DIAGNOSES:  1. Carotid occlusive disease.  2. Right bundle branch block.  3. Urinary tract infection.  4. Hypertension.   CONDITION ON DISCHARGE:  Stable and improved.   HISTORY OF PRESENT ILLNESS:  This patient was admitted through the ED with  generalized weakness, difficulty standing and walking.  She was evaluated by  ED physician.  She was put on __________for eye infection.  She was admitted  to rule out ischemic heart disease.   PHYSICAL EXAMINATION:  VITAL SIGNS:  Blood pressure 188/66, respirations 24,  pulse 76, temperature 97.6.  HEENT:  Eyes PERRLA.  TMs negative.  Oropharynx benign.  NECK:  Supple.  No JVD or thyroid abnormalities.  LUNGS:  Clear to P&A.  HEART:  Regular rate and rhythm.  No murmurs.  ABDOMEN:  No palpable organs or masses.  SKIN:  Warm and dry.  EXTREMITIES:  Free of edema.   LABORATORY DATA:  Admission CBC:  WBC 8700, hemoglobin 13.9, hematocrit  40.5.  Chemistry:  Sodium 137, potassium 4.6, chloride 104, CO2 28, glucose  121, BUN 18, creatinine 1.2, calcium 8.2.  Subsequent chemistries on June 27, 2003:  Sodium 139, potassium 4.3, chloride 105, CO2 28, glucose 105, BUN  20, creatinine 1.2, calcium 8.7.  CK on admission 52,  CK MB 2.3, troponin 0.03.  Subsequent enzymes 4505, CK 114, CK MB 4.1,  troponin 0.02.  Urinalysis 11-20 WBC's.  Urine culture no growth.  CT of  head:  Mild generalized atrophy.  Mild changes of small vessel disease of  white matter.  Chest x-ray:  Cardiac enlargement and chronic interstitial  changes.  No acute findings.   Carotid Duplex ultrasound, extensive bilateral  carotid plaque formation, producing 60-69% stenosis of the right internal  carotid artery.  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  Dobutamine stress Cardiolite study.  No  convincing evidence of myocardial ischemia or infarction.  Echocardiogram:  Ejection fraction 60-65%.  Moderate left ventricular hypertrophy.   HOSPITAL COURSE:  The patient, at the time of her admission, was placed on  nasal O2 at 2 liters.  Cardiac enzymes were monitored.  Vital signs were  monitored.  She was given a sublingual nitroglycerin p.r.n. pain.  She was  placed on Cipro 500 mg b.i.d.  Urine was cultured.  The patient was  subsequently seen by Cardiology.  Adenosine Cardiolite Study was scheduled.  Did not show any evidence of myocardial  ischemia.  Also, carotid Doppler  ultrasound was ordered and showed evidence of 60-69% stenosis of the right  internal carotid artery and right subclavian steel.  The patient was placed  on Plavix 75 mg daily.  Altace 5 mg daily.  Subsequently,  hydrochlorothiazide  12.5 mg daily.  The patient showed progressive  improvement throughout her hospital stay and was able to be discharged after  four days hospitalization.  The patient was discharged on Plavix 75 mg  daily, Altace 2.5 mg daily, Cipro 500 mg daily and hydrochlorothiazide 12.5  mg daily.     ___________________________________________                                         Ishmael Holter Renard Matter, M.D.   AGM/MEDQ  D:  07/22/2003  T:  07/22/2003  Job:  [id number]

## 2010-08-07 NOTE — Group Therapy Note (Signed)
NAME:  [name], TALBOT                          ACCOUNT NO.:  [id number]   MEDICAL RECORD NO.:  [id number]                   PATIENT TYPE:  INP   LOCATION:  A206                                 FACILITY:  [facility]   PHYSICIAN:  Angus G. Renard Matter, M.D.              DATE OF BIRTH:  [birth year]   DATE OF PROCEDURE:  DATE OF DISCHARGE:                                   PROGRESS NOTE   SUBJECTIVE:  This patient was admitted with a complaint of weakness.  She  did have urinary tract infection which is being treated.  She has been seen  by cardiology, and Cardiolite dobutamine study has been done.  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   OBJECTIVE:  Vital signs:  Blood pressure 94/48, respirations 20, pulse 76,  temperature 97.8.  Heart regular rhythm.  Lungs clear to P&A.  Abdomen, no  palpable organs or masses.   ASSESSMENT:  The patient was admitted to the hospital following an episode  of weakness.  She did have an abnormal EKG.  She has chronic right bundle  branch block, and carotid artery occlusive disease.  She is being evaluated  for myocardial ischemia.   PLAN:  Continue regimen.  We will discuss the situation with cardiology  concerning possibility of discharge.  The patient will need vascular surgery  consultation.      ___________________________________________                                            Ishmael Holter Renard Matter, M.D.   AGM/MEDQ  D:  06/28/2003  T:  06/28/2003  Job:  [id number]

## 2010-08-07 NOTE — Group Therapy Note (Signed)
NAME:  [name], [name]                          ACCOUNT NO.:  [id number]   MEDICAL RECORD NO.:  [id number]                   PATIENT TYPE:  INP   LOCATION:  A206                                 FACILITY:  [facility]   PHYSICIAN:  Angus G. Renard Matter, M.D.              DATE OF BIRTH:  1926/05/14   DATE OF PROCEDURE:  DATE OF DISCHARGE:                                   PROGRESS NOTE   SUBJECTIVE:  This patient was admitted with the complaint of weakness.  She  had a urinary tract infection which is being treated.  She was seen by  cardiology, and Cardiolite dobutamine study has been ordered.  She did have  a carotid Doppler ultrasound which showed extensive right carotid plaque  causing 60 to 69% stenosis in the right internal carotid artery and 91 to  99% stenosis of the left internal carotid artery with subclavian steal,  right subclavian artery.   OBJECTIVE:  Vital signs:  Blood pressure 118/67, respirations 18, pulse 77,  temperature 97.7.  Lungs are clear to P&A.  Heart is regular rhythm.  Abdomen, no palpable organs or masses.   ASSESSMENT:  The patient was admitted to the hospital following an episode  of weakness.  She did have an abnormal EKG, and apparently has carotid  artery occlusive disease.  She is being studied of myocardial ischemia.  Will need vascular consultation.   PLAN:  Continue current regimen.      ___________________________________________                                            Ishmael Holter. Renard Matter, M.D.   AGM/MEDQ  D:  06/26/2003  T:  06/26/2003  Job:  [id number]

## 2010-08-07 NOTE — Group Therapy Note (Signed)
[name], [name]                            ACCOUNT NO.:  [id number]   MEDICAL RECORD NO.:  [id number]                  PATIENT TYPE:   LOCATION:                                       FACILITY:   PHYSICIAN:  Angus G. Renard Matter, M.D.              DATE OF BIRTH:   DATE OF PROCEDURE:  06/27/2003  DATE OF DISCHARGE:                                   PROGRESS NOTE   This patient is scheduled for a dobutamine stress test today.  She did have  elevation of blood pressure yesterday at 190/60.  Altace 5 mg was given.   OBJECTIVE:  VITAL SIGNS:  Blood pressure 146/64, respirations 20, pulse 70,  temperature 97.4.  LUNGS:  Clear to P&A. HEART:  Regular rhythm.  ABDOMEN:  No palpable organs or masses.   ASSESSMENT:  The patient was admitted with urinary tract infection,  weakness, has possible underlying ischemic heart disease and has carotid  occlusive disease.   PLAN:  To proceed with dobutamine Cardiolite study and evaluation of  peripheral vascular disease.      ___________________________________________                                            Ishmael Holter. Renard Matter, M.D.   AGM/MEDQ  D:  06/27/2003  T:  06/27/2003  Job:  [id number]

## 2010-12-17 LAB — COMPREHENSIVE METABOLIC PANEL
ALT: 16
AST: 25
Albumin: 3.6
Alkaline Phosphatase: 72
BUN: 26 — ABNORMAL HIGH
CO2: 29
Calcium: 8.9
Chloride: 108
Creatinine, Ser: 1.37 — ABNORMAL HIGH
GFR calc Af Amer: 45 — ABNORMAL LOW
GFR calc non Af Amer: 37 — ABNORMAL LOW
Glucose, Bld: 154 — ABNORMAL HIGH
Potassium: 5
Sodium: 142
Total Bilirubin: 0.6
Total Protein: 6.6

## 2010-12-17 LAB — CBC
HCT: 35 — ABNORMAL LOW
Hemoglobin: 11.6 — ABNORMAL LOW
MCHC: 33.2
MCV: 97
Platelets: 213
RBC: 3.61 — ABNORMAL LOW
RDW: 12.8
WBC: 8.2

## 2010-12-17 LAB — URINALYSIS, ROUTINE W REFLEX MICROSCOPIC
Bilirubin Urine: NEGATIVE
Glucose, UA: NEGATIVE
Hgb urine dipstick: NEGATIVE
Ketones, ur: NEGATIVE
Nitrite: NEGATIVE
Protein, ur: NEGATIVE
Specific Gravity, Urine: 1.015
Urobilinogen, UA: 0.2
pH: 5.5

## 2010-12-17 LAB — DIFFERENTIAL
Eosinophils Absolute: 0.1
Eosinophils Relative: 1
Lymphs Abs: 1.5
Monocytes Absolute: 0.6
Monocytes Relative: 7

## 2011-01-08 ENCOUNTER — Other Ambulatory Visit (HOSPITAL_COMMUNITY): Payer: Self-pay | Admitting: Family Medicine

## 2011-01-08 DIAGNOSIS — I999 Unspecified disorder of circulatory system: Secondary | ICD-10-CM

## 2011-01-08 DIAGNOSIS — I10 Essential (primary) hypertension: Secondary | ICD-10-CM

## 2011-01-08 DIAGNOSIS — E785 Hyperlipidemia, unspecified: Secondary | ICD-10-CM

## 2011-01-12 ENCOUNTER — Ambulatory Visit (HOSPITAL_COMMUNITY)
Admission: RE | Admit: 2011-01-12 | Discharge: 2011-01-12 | Disposition: A | Discharge status: Home / Self Care | Payer: Medicare Other | Source: Ambulatory Visit | Attending: Family Medicine | Admitting: Family Medicine

## 2011-01-12 DIAGNOSIS — E785 Hyperlipidemia, unspecified: Secondary | ICD-10-CM

## 2011-01-12 DIAGNOSIS — I10 Essential (primary) hypertension: Secondary | ICD-10-CM | POA: Insufficient documentation

## 2011-01-12 DIAGNOSIS — I999 Unspecified disorder of circulatory system: Secondary | ICD-10-CM

## 2011-01-12 DIAGNOSIS — I6529 Occlusion and stenosis of unspecified carotid artery: Secondary | ICD-10-CM | POA: Insufficient documentation

## 2011-01-12 DIAGNOSIS — R109 Unspecified abdominal pain: Secondary | ICD-10-CM | POA: Insufficient documentation

## 2011-01-12 DIAGNOSIS — I7090 Unspecified atherosclerosis: Secondary | ICD-10-CM | POA: Insufficient documentation

## 2012-02-04 ENCOUNTER — Ambulatory Visit (HOSPITAL_COMMUNITY)
Admission: RE | Admit: 2012-02-04 | Discharge: 2012-02-04 | Disposition: A | Discharge status: Home / Self Care | Payer: Medicare Other | Source: Ambulatory Visit | Attending: Family Medicine | Admitting: Family Medicine

## 2012-02-04 ENCOUNTER — Other Ambulatory Visit (HOSPITAL_COMMUNITY): Payer: Self-pay | Admitting: Family Medicine

## 2012-02-04 DIAGNOSIS — R262 Difficulty in walking, not elsewhere classified: Secondary | ICD-10-CM

## 2012-02-04 DIAGNOSIS — I70209 Unspecified atherosclerosis of native arteries of extremities, unspecified extremity: Secondary | ICD-10-CM | POA: Insufficient documentation

## 2012-11-22 ENCOUNTER — Ambulatory Visit (HOSPITAL_COMMUNITY)
Admission: RE | Admit: 2012-11-22 | Discharge: 2012-11-22 | Disposition: A | Discharge status: Home / Self Care | Payer: Medicare Other | Source: Ambulatory Visit | Attending: Family Medicine | Admitting: Family Medicine

## 2012-11-22 ENCOUNTER — Other Ambulatory Visit (HOSPITAL_COMMUNITY): Payer: Self-pay | Admitting: Family Medicine

## 2012-11-22 DIAGNOSIS — R52 Pain, unspecified: Secondary | ICD-10-CM

## 2012-11-22 DIAGNOSIS — M25559 Pain in unspecified hip: Secondary | ICD-10-CM | POA: Insufficient documentation

## 2012-11-28 ENCOUNTER — Other Ambulatory Visit (HOSPITAL_COMMUNITY): Payer: Self-pay | Admitting: Family Medicine

## 2012-11-28 DIAGNOSIS — M810 Age-related osteoporosis without current pathological fracture: Secondary | ICD-10-CM

## 2012-12-04 ENCOUNTER — Ambulatory Visit (HOSPITAL_COMMUNITY)
Admission: RE | Admit: 2012-12-04 | Discharge: 2012-12-04 | Disposition: A | Discharge status: Home / Self Care | Payer: Medicare Other | Source: Ambulatory Visit | Attending: Family Medicine | Admitting: Family Medicine

## 2012-12-04 DIAGNOSIS — Z780 Asymptomatic menopausal state: Secondary | ICD-10-CM | POA: Insufficient documentation

## 2012-12-04 DIAGNOSIS — M818 Other osteoporosis without current pathological fracture: Secondary | ICD-10-CM | POA: Insufficient documentation

## 2012-12-04 DIAGNOSIS — M810 Age-related osteoporosis without current pathological fracture: Secondary | ICD-10-CM

## 2013-04-13 ENCOUNTER — Emergency Department (HOSPITAL_COMMUNITY): Payer: Medicare Other

## 2013-04-13 ENCOUNTER — Inpatient Hospital Stay (HOSPITAL_COMMUNITY)
Admission: EM | Admit: 2013-04-13 | Discharge: 2013-04-22 | DRG: 871 | Disposition: E | Payer: Medicare Other | Attending: Pulmonary Disease | Admitting: Pulmonary Disease

## 2013-04-13 ENCOUNTER — Inpatient Hospital Stay (HOSPITAL_COMMUNITY): Payer: Medicare Other

## 2013-04-13 ENCOUNTER — Encounter (HOSPITAL_COMMUNITY): Payer: Self-pay | Admitting: Emergency Medicine

## 2013-04-13 DIAGNOSIS — Z515 Encounter for palliative care: Secondary | ICD-10-CM

## 2013-04-13 DIAGNOSIS — L8992 Pressure ulcer of unspecified site, stage 2: Secondary | ICD-10-CM | POA: Diagnosis present

## 2013-04-13 DIAGNOSIS — E7800 Pure hypercholesterolemia, unspecified: Secondary | ICD-10-CM | POA: Diagnosis present

## 2013-04-13 DIAGNOSIS — N189 Chronic kidney disease, unspecified: Secondary | ICD-10-CM

## 2013-04-13 DIAGNOSIS — E876 Hypokalemia: Secondary | ICD-10-CM

## 2013-04-13 DIAGNOSIS — R7309 Other abnormal glucose: Secondary | ICD-10-CM | POA: Diagnosis present

## 2013-04-13 DIAGNOSIS — I6529 Occlusion and stenosis of unspecified carotid artery: Secondary | ICD-10-CM | POA: Diagnosis present

## 2013-04-13 DIAGNOSIS — N179 Acute kidney failure, unspecified: Secondary | ICD-10-CM

## 2013-04-13 DIAGNOSIS — L89309 Pressure ulcer of unspecified buttock, unspecified stage: Secondary | ICD-10-CM | POA: Diagnosis present

## 2013-04-13 DIAGNOSIS — D649 Anemia, unspecified: Secondary | ICD-10-CM | POA: Diagnosis present

## 2013-04-13 DIAGNOSIS — L8990 Pressure ulcer of unspecified site, unspecified stage: Secondary | ICD-10-CM

## 2013-04-13 DIAGNOSIS — R4182 Altered mental status, unspecified: Secondary | ICD-10-CM

## 2013-04-13 DIAGNOSIS — J9600 Acute respiratory failure, unspecified whether with hypoxia or hypercapnia: Secondary | ICD-10-CM

## 2013-04-13 DIAGNOSIS — R0902 Hypoxemia: Secondary | ICD-10-CM

## 2013-04-13 DIAGNOSIS — I129 Hypertensive chronic kidney disease with stage 1 through stage 4 chronic kidney disease, or unspecified chronic kidney disease: Secondary | ICD-10-CM | POA: Diagnosis present

## 2013-04-13 DIAGNOSIS — R739 Hyperglycemia, unspecified: Secondary | ICD-10-CM | POA: Diagnosis present

## 2013-04-13 DIAGNOSIS — Z8673 Personal history of transient ischemic attack (TIA), and cerebral infarction without residual deficits: Secondary | ICD-10-CM

## 2013-04-13 DIAGNOSIS — I214 Non-ST elevation (NSTEMI) myocardial infarction: Secondary | ICD-10-CM

## 2013-04-13 DIAGNOSIS — R6520 Severe sepsis without septic shock: Secondary | ICD-10-CM

## 2013-04-13 DIAGNOSIS — R6521 Severe sepsis with septic shock: Secondary | ICD-10-CM

## 2013-04-13 DIAGNOSIS — J90 Pleural effusion, not elsewhere classified: Secondary | ICD-10-CM

## 2013-04-13 DIAGNOSIS — A419 Sepsis, unspecified organism: Principal | ICD-10-CM | POA: Diagnosis present

## 2013-04-13 DIAGNOSIS — L8994 Pressure ulcer of unspecified site, stage 4: Secondary | ICD-10-CM | POA: Diagnosis present

## 2013-04-13 DIAGNOSIS — Z66 Do not resuscitate: Secondary | ICD-10-CM | POA: Diagnosis present

## 2013-04-13 DIAGNOSIS — L97109 Non-pressure chronic ulcer of unspecified thigh with unspecified severity: Secondary | ICD-10-CM | POA: Diagnosis present

## 2013-04-13 DIAGNOSIS — I5041 Acute combined systolic (congestive) and diastolic (congestive) heart failure: Secondary | ICD-10-CM

## 2013-04-13 DIAGNOSIS — K7200 Acute and subacute hepatic failure without coma: Secondary | ICD-10-CM | POA: Diagnosis present

## 2013-04-13 DIAGNOSIS — F0390 Unspecified dementia without behavioral disturbance: Secondary | ICD-10-CM | POA: Diagnosis present

## 2013-04-13 DIAGNOSIS — L89899 Pressure ulcer of other site, unspecified stage: Secondary | ICD-10-CM | POA: Diagnosis present

## 2013-04-13 DIAGNOSIS — E870 Hyperosmolality and hypernatremia: Secondary | ICD-10-CM

## 2013-04-13 DIAGNOSIS — J9690 Respiratory failure, unspecified, unspecified whether with hypoxia or hypercapnia: Secondary | ICD-10-CM | POA: Diagnosis present

## 2013-04-13 HISTORY — DX: Pure hypercholesterolemia, unspecified: E78.00

## 2013-04-13 HISTORY — DX: Unspecified dementia, unspecified severity, without behavioral disturbance, psychotic disturbance, mood disturbance, and anxiety: F03.90

## 2013-04-13 HISTORY — DX: Other specified disorders of bone density and structure, unspecified site: M85.80

## 2013-04-13 HISTORY — DX: Chronic kidney disease, unspecified: N18.9

## 2013-04-13 HISTORY — DX: Cerebral infarction, unspecified: I63.9

## 2013-04-13 HISTORY — DX: Essential (primary) hypertension: I10

## 2013-04-13 LAB — URINALYSIS, ROUTINE W REFLEX MICROSCOPIC
BILIRUBIN URINE: NEGATIVE
GLUCOSE, UA: NEGATIVE mg/dL
Hgb urine dipstick: NEGATIVE
Ketones, ur: NEGATIVE mg/dL
Leukocytes, UA: NEGATIVE
NITRITE: NEGATIVE
PH: 5 (ref 5.0–8.0)
Protein, ur: 100 mg/dL — AB
Urobilinogen, UA: 0.2 mg/dL (ref 0.0–1.0)

## 2013-04-13 LAB — BLOOD GAS, ARTERIAL
ACID-BASE EXCESS: 6.9 mmol/L — AB (ref 0.0–2.0)
Acid-Base Excess: 7.2 mmol/L — ABNORMAL HIGH (ref 0.0–2.0)
BICARBONATE: 32 meq/L — AB (ref 20.0–24.0)
Bicarbonate: 31.7 mEq/L — ABNORMAL HIGH (ref 20.0–24.0)
DELIVERY SYSTEMS: POSITIVE
Delivery systems: POSITIVE
Drawn by: 22223
Expiratory PAP: 6
Expiratory PAP: 6
FIO2: 100 %
FIO2: 100 %
INSPIRATORY PAP: 12
Inspiratory PAP: 12
LHR: 10 {breaths}/min
O2 SAT: 93.6 %
O2 Saturation: 95.4 %
PATIENT TEMPERATURE: 37
PATIENT TEMPERATURE: 37
PH ART: 7.406 (ref 7.350–7.450)
PO2 ART: 83.2 mmHg (ref 80.0–100.0)
TCO2: 29.5 mmol/L (ref 0–100)
TCO2: 29.7 mmol/L (ref 0–100)
pCO2 arterial: 52.7 mmHg — ABNORMAL HIGH (ref 35.0–45.0)
pCO2 arterial: 52 mmHg — ABNORMAL HIGH (ref 35.0–45.0)
pH, Arterial: 7.396 (ref 7.350–7.450)
pO2, Arterial: 75.2 mmHg — ABNORMAL LOW (ref 80.0–100.0)

## 2013-04-13 LAB — CBC WITH DIFFERENTIAL/PLATELET
Basophils Absolute: 0 10*3/uL (ref 0.0–0.1)
Basophils Relative: 0 % (ref 0–1)
EOS ABS: 0 10*3/uL (ref 0.0–0.7)
EOS PCT: 0 % (ref 0–5)
HCT: 32.9 % — ABNORMAL LOW (ref 36.0–46.0)
HEMOGLOBIN: 10.2 g/dL — AB (ref 12.0–15.0)
LYMPHS ABS: 0.5 10*3/uL — AB (ref 0.7–4.0)
Lymphocytes Relative: 4 % — ABNORMAL LOW (ref 12–46)
MCH: 32.8 pg (ref 26.0–34.0)
MCHC: 31 g/dL (ref 30.0–36.0)
MCV: 105.8 fL — AB (ref 78.0–100.0)
MONO ABS: 0.7 10*3/uL (ref 0.1–1.0)
MONOS PCT: 6 % (ref 3–12)
Neutro Abs: 10.3 10*3/uL — ABNORMAL HIGH (ref 1.7–7.7)
Neutrophils Relative %: 90 % — ABNORMAL HIGH (ref 43–77)
Platelets: 177 10*3/uL (ref 150–400)
RBC: 3.11 MIL/uL — AB (ref 3.87–5.11)
RDW: 18.8 % — ABNORMAL HIGH (ref 11.5–15.5)
WBC: 11.5 10*3/uL — ABNORMAL HIGH (ref 4.0–10.5)

## 2013-04-13 LAB — COMPREHENSIVE METABOLIC PANEL
ALBUMIN: 2.6 g/dL — AB (ref 3.5–5.2)
ALK PHOS: 107 U/L (ref 39–117)
ALT: 289 U/L — ABNORMAL HIGH (ref 0–35)
AST: 589 U/L — AB (ref 0–37)
BILIRUBIN TOTAL: 0.5 mg/dL (ref 0.3–1.2)
BUN: 45 mg/dL — AB (ref 6–23)
CHLORIDE: 102 meq/L (ref 96–112)
CO2: 32 mEq/L (ref 19–32)
Calcium: 7.8 mg/dL — ABNORMAL LOW (ref 8.4–10.5)
Creatinine, Ser: 1.82 mg/dL — ABNORMAL HIGH (ref 0.50–1.10)
GFR calc Af Amer: 28 mL/min — ABNORMAL LOW (ref >90)
GFR calc non Af Amer: 24 mL/min — ABNORMAL LOW (ref >90)
Glucose, Bld: 154 mg/dL — ABNORMAL HIGH (ref 70–99)
POTASSIUM: 2.8 meq/L — AB (ref 3.7–5.3)
Sodium: 148 mEq/L — ABNORMAL HIGH (ref 137–147)
Total Protein: 6.1 g/dL (ref 6.0–8.3)

## 2013-04-13 LAB — URINE MICROSCOPIC-ADD ON

## 2013-04-13 LAB — GLUCOSE, CAPILLARY: Glucose-Capillary: 139 mg/dL — ABNORMAL HIGH (ref 70–99)

## 2013-04-13 LAB — TROPONIN I
TROPONIN I: 0.68 ng/mL — AB (ref <0.30)
TROPONIN I: 1.17 ng/mL — AB (ref <0.30)

## 2013-04-13 LAB — LIPASE, BLOOD: LIPASE: 17 U/L (ref 11–59)

## 2013-04-13 LAB — LACTIC ACID, PLASMA: LACTIC ACID, VENOUS: 1.3 mmol/L (ref 0.5–2.2)

## 2013-04-13 LAB — PROTIME-INR
INR: 1.34 (ref 0.00–1.49)
Prothrombin Time: 16.3 seconds — ABNORMAL HIGH (ref 11.6–15.2)

## 2013-04-13 LAB — PRO B NATRIURETIC PEPTIDE: Pro B Natriuretic peptide (BNP): 31248 pg/mL — ABNORMAL HIGH (ref 0–450)

## 2013-04-13 MED ORDER — FENTANYL CITRATE 0.05 MG/ML IJ SOLN
INTRAMUSCULAR | Status: AC
  Filled 2013-04-13: qty 50

## 2013-04-13 MED ORDER — NOREPINEPHRINE BITARTRATE 1 MG/ML IJ SOLN
2.0000 ug/min | INTRAMUSCULAR | Status: DC
  Administered 2013-04-14: 10 ug/min via INTRAVENOUS
  Administered 2013-04-14: 18.8 ug/min via INTRAVENOUS
  Filled 2013-04-13 (×2): qty 4

## 2013-04-13 MED ORDER — MIDAZOLAM HCL 2 MG/2ML IJ SOLN
2.0000 mg | INTRAMUSCULAR | Status: DC | PRN
  Administered 2013-04-13 – 2013-04-14 (×2): 2 mg via INTRAVENOUS
  Filled 2013-04-13 (×2): qty 2

## 2013-04-13 MED ORDER — HEPARIN (PORCINE) IN NACL 100-0.45 UNIT/ML-% IJ SOLN: 550.0000 [IU]/h | INTRAMUSCULAR | Status: DC

## 2013-04-13 MED ORDER — MIDAZOLAM HCL 2 MG/2ML IJ SOLN
INTRAMUSCULAR | Status: AC
  Administered 2013-04-14: 2 mg via INTRAVENOUS
  Filled 2013-04-13: qty 2

## 2013-04-13 MED ORDER — HEPARIN BOLUS VIA INFUSION: 2000.0000 [IU] | Freq: Once | INTRAVENOUS | Status: DC

## 2013-04-13 MED ORDER — SODIUM CHLORIDE 0.9 % IV SOLN
25.0000 ug/h | INTRAVENOUS | Status: DC
  Administered 2013-04-13: 25 ug/h via INTRAVENOUS
  Filled 2013-04-13 (×2): qty 50

## 2013-04-13 MED ORDER — POTASSIUM CHLORIDE 10 MEQ/100ML IV SOLN
10.0000 meq | Freq: Once | INTRAVENOUS | Status: AC
  Administered 2013-04-13: 10 meq via INTRAVENOUS
  Filled 2013-04-13: qty 100

## 2013-04-13 MED ORDER — NOREPINEPHRINE BITARTRATE 1 MG/ML IJ SOLN
INTRAMUSCULAR | Status: AC
  Filled 2013-04-13: qty 4

## 2013-04-13 MED ORDER — HEPARIN (PORCINE) IN NACL 100-0.45 UNIT/ML-% IJ SOLN
550.0000 [IU]/h | INTRAMUSCULAR | Status: DC
  Administered 2013-04-13: 550 [IU]/h via INTRAVENOUS

## 2013-04-13 MED ORDER — ETOMIDATE 2 MG/ML IV SOLN
INTRAVENOUS | Status: AC
  Administered 2013-04-13: 20 mg via INTRAVENOUS
  Filled 2013-04-13: qty 20

## 2013-04-13 MED ORDER — FUROSEMIDE 10 MG/ML IJ SOLN
40.0000 mg | Freq: Once | INTRAMUSCULAR | Status: AC
  Administered 2013-04-13: 40 mg via INTRAVENOUS
  Filled 2013-04-13: qty 4

## 2013-04-13 MED ORDER — SODIUM CHLORIDE 0.9 % IV SOLN: INTRAVENOUS | Status: DC

## 2013-04-13 MED ORDER — HEPARIN (PORCINE) IN NACL 100-0.45 UNIT/ML-% IJ SOLN
14.0000 [IU]/kg/h | Freq: Once | INTRAMUSCULAR | Status: DC
  Filled 2013-04-13: qty 250

## 2013-04-13 MED ORDER — HEPARIN SODIUM (PORCINE) 5000 UNIT/ML IJ SOLN
4000.0000 [IU] | Freq: Once | INTRAMUSCULAR | Status: AC
  Administered 2013-04-13: 4000 [IU] via INTRAVENOUS
  Filled 2013-04-13: qty 1

## 2013-04-13 MED ORDER — SUCCINYLCHOLINE CHLORIDE 20 MG/ML IJ SOLN
INTRAMUSCULAR | Status: AC
  Administered 2013-04-13: 100 mg via INTRAVENOUS
  Filled 2013-04-13: qty 1

## 2013-04-13 MED ORDER — SODIUM CHLORIDE 0.9 % IV BOLUS (SEPSIS)
250.0000 mL | Freq: Once | INTRAVENOUS | Status: AC
  Administered 2013-04-13: 250 mL via INTRAVENOUS

## 2013-04-13 MED ORDER — SODIUM CHLORIDE 0.9 % IV SOLN
INTRAVENOUS | Status: AC
  Administered 2013-04-14: 20 mL/h via INTRAVENOUS

## 2013-04-13 MED ORDER — LIDOCAINE HCL (CARDIAC) 20 MG/ML IV SOLN
INTRAVENOUS | Status: AC
  Filled 2013-04-13: qty 5

## 2013-04-13 MED ORDER — ROCURONIUM BROMIDE 50 MG/5ML IV SOLN
INTRAVENOUS | Status: AC
  Filled 2013-04-13: qty 2

## 2013-04-13 NOTE — ED Notes (Signed)
Pt given a , bolus.

## 2013-04-13 NOTE — ED Notes (Signed)
Pt family repots fever and sob today.

## 2013-04-13 NOTE — ED Provider Notes (Addendum)
CSN: [id number]     Arrival date & time 04/04/2013  1258 History   First MD Initiated Contact with Patient 04/01/2013 1346     Chief Complaint  Patient presents with  . Shortness of Breath   (Consider location/radiation/quality/duration/timing/severity/associated sxs/prior Treatment) The history is provided by a relative and the EMS personnel.   80 year old female brought in [facility] EMS family members called patient was noted to be less active today not wanting D. does seem to have fever and shortness of breath. Patient has been bedridden for 3 months. Patient is not a DO NOT RESUSCITATE. Discussed with family they would prefer acute interventions but does not want any long-term interventions. Patient arrived hypoxic on her percent nonrebreather sats below 90%. Also tachypnea. Respiratory rate around 32. Blood pressure systolic was good at 102. Patient has a history of dementia not able to verbalize much here. Patient in distress.  Past Medical History  Diagnosis Date  . Dementia   . Renal disorder   . Hypertension   . High cholesterol    History reviewed. No pertinent past surgical history. No family history on file. History  Substance Use Topics  . Smoking status: Unknown If Ever Smoked  . Smokeless tobacco: Not on file  . Alcohol Use: No   OB History   Grav Para Term Preterm Abortions TAB SAB Ect Mult Living                 Review of Systems  Unable to perform ROS Constitutional: Positive for fever.  Respiratory: Positive for shortness of breath.     Allergies  Review of patient's allergies indicates no known allergies.  Home Medications   Current Outpatient Rx  Name  Route  Sig  Dispense  Refill  . alendronate (FOSAMAX) 70 MG tablet   Oral   Take 70 mg by mouth once a week. Take with a full glass of water on an empty stomach on Saturday         . calcium-vitamin D (OSCAL) 250-125 MG-UNIT per tablet   Oral   Take 1 tablet by mouth 2 (two) times daily.         .  Cholecalciferol (VITAMIN D-3 PO)   Oral   Take 1 tablet by mouth 2 (two) times daily.         . clopidogrel (PLAVIX) 75 MG tablet   Oral   Take 75 mg by mouth daily with breakfast.         . LORazepam (ATIVAN) 1 MG tablet   Oral   Take 1 mg by mouth every 4 (four) hours as needed for anxiety.         . lovastatin (MEVACOR) 40 MG tablet   Oral   Take 40 mg by mouth at bedtime.         . niacin 500 MG tablet   Oral   Take 500 mg by mouth daily.         . sodium polystyrene (KAYEXALATE) 15 GM/60ML suspension   Oral   Take 15 g by mouth 2 (two) times a week. On Mondays and Thursdays         . vitamin E 400 UNIT capsule   Oral   Take 400 Units by mouth 2 (two) times daily.         Marland Kitchen zolpidem (AMBIEN) 5 MG tablet   Oral   Take 5 mg by mouth at bedtime as needed for sleep.  BP 104/60  Pulse 73  Temp(Src) 99.6 F (37.6 C) (Rectal)  Resp 14  Ht 5\' 2"  (1.575 m)  Wt 100 lb (45.36 kg)  BMI 18.29 kg/m2  SpO2 99% Physical Exam  Nursing note and vitals reviewed. Constitutional: She appears well-developed and well-nourished. She appears distressed.  HENT:  Head: Normocephalic and atraumatic.  Mucous membranes dry  Eyes:  Sclerae erythematous no discharge. Patient's left eye remains open at all times. Cornea is very dried out apparently that's baseline as per family.  Neck: Neck supple.  Cardiovascular: Normal rate, regular rhythm and normal heart sounds.   Pulmonary/Chest: She is in respiratory distress. She has no wheezes. She has rales.  Abdominal: Soft. There is no tenderness.  Musculoskeletal: Normal range of motion. She exhibits edema.  Patient is edematous and not extremities right lower extremity more so than left. Patient with some bruising.   Pre-existing known decubitus on the left buttocks hip area.  Neurological:  Patient awake not buried talkative having respiratory difficulty. Is moving all 4 extremities.  Skin: Skin is warm. No rash  noted.    ED Course  INTUBATION Date/Time: 04/07/2013 10:21 PM Performed by: Shelda JakesZACKOWSKI, Milbert Bixler W. Authorized by: Shelda JakesZACKOWSKI, Yuridiana Formanek W. Consent: Verbal consent not obtained. written consent not obtained. The procedure was performed in an emergent situation. Indications: respiratory distress and hypercapnia Intubation method: lighted stylet Patient status: paralyzed (RSI) Preoxygenation: BVM Sedatives: etomidate Paralytic: succinylcholine Laryngoscope size: Mac 3 Tube size: 7.0 mm Tube type: cuffed Number of attempts: 1 Cricoid pressure: no Cords visualized: yes Post-procedure assessment: chest rise and CO2 detector Breath sounds: equal Cuff inflated: yes ETT to lip: 18 cm Tube secured with: ETT holder Patient tolerance: Patient tolerated the procedure well with no immediate complications.   (including critical care time) Labs Review Labs Reviewed  BLOOD GAS, ARTERIAL - Abnormal; Notable for the following:    pCO2 arterial 52.7 (*)    pO2, Arterial 75.2 (*)    Bicarbonate 31.7 (*)    Acid-Base Excess 6.9 (*)    Allens test (pass/fail) NOT INDICATED (*)    All other components within normal limits  PRO B NATRIURETIC PEPTIDE - Abnormal; Notable for the following:    Pro B Natriuretic peptide (BNP) 31248.0 (*)    All other components within normal limits  TROPONIN I - Abnormal; Notable for the following:    Troponin I 0.68 (*)    All other components within normal limits  URINALYSIS, ROUTINE W REFLEX MICROSCOPIC - Abnormal; Notable for the following:    Specific Gravity, Urine >1.030 (*)    Protein, ur 100 (*)    All other components within normal limits  COMPREHENSIVE METABOLIC PANEL - Abnormal; Notable for the following:    Sodium 148 (*)    Potassium 2.8 (*)    Glucose, Bld 154 (*)    BUN 45 (*)    Creatinine, Ser 1.82 (*)    Calcium 7.8 (*)    Albumin 2.6 (*)    AST 589 (*)    ALT 289 (*)    GFR calc non Af Amer 24 (*)    GFR calc Af Amer 28 (*)    All other  components within normal limits  PROTIME-INR - Abnormal; Notable for the following:    Prothrombin Time 16.3 (*)    All other components within normal limits  CBC WITH DIFFERENTIAL - Abnormal; Notable for the following:    WBC 11.5 (*)    RBC 3.11 (*)    Hemoglobin  10.2 (*)    HCT 32.9 (*)    MCV 105.8 (*)    RDW 18.8 (*)    Neutrophils Relative % 90 (*)    Neutro Abs 10.3 (*)    Lymphocytes Relative 4 (*)    Lymphs Abs 0.5 (*)    All other components within normal limits  GLUCOSE, CAPILLARY - Abnormal; Notable for the following:    Glucose-Capillary 139 (*)    All other components within normal limits  URINE MICROSCOPIC-ADD ON - Abnormal; Notable for the following:    Squamous Epithelial / LPF MANY (*)    Bacteria, UA MANY (*)    Casts HYALINE CASTS (*)    All other components within normal limits  TROPONIN I - Abnormal; Notable for the following:    Troponin I 1.17 (*)    All other components within normal limits  CULTURE, BLOOD (ROUTINE X 2)  CULTURE, BLOOD (ROUTINE X 2)  URINE CULTURE  LACTIC ACID, PLASMA  LIPASE, BLOOD   Results for orders placed during the hospital encounter of 03/29/2013  CULTURE, BLOOD (ROUTINE X 2)      Result Value Range   Specimen Description LEFT ANTECUBITAL     Special Requests AEB 4CC     Culture PENDING     Report Status PENDING    CULTURE, BLOOD (ROUTINE X 2)      Result Value Range   Specimen Description ARTERIAL BLOOD     Special Requests BAA 2 CC EACH     Culture PENDING     Report Status PENDING    BLOOD GAS, ARTERIAL      Result Value Range   FIO2 100.00     Delivery systems BILEVEL POSITIVE AIRWAY PRESSURE     Rate 10     Inspiratory PAP 12     Expiratory PAP 6     pH, Arterial 7.396  7.350 - 7.450   pCO2 arterial 52.7 (*) 35.0 - 45.0 mmHg   pO2, Arterial 75.2 (*) 80.0 - 100.0 mmHg   Bicarbonate 31.7 (*) 20.0 - 24.0 mEq/L   TCO2 29.5  0 - 100 mmol/L   Acid-Base Excess 6.9 (*) 0.0 - 2.0 mmol/L   O2 Saturation 93.6      Patient temperature 37.0     Collection site BRACHIAL ARTERY     Drawn by COLLECTED BY RT     Sample type ARTERIAL     Allens test (pass/fail) NOT INDICATED (*) PASS  LACTIC ACID, PLASMA      Result Value Range   Lactic Acid, Venous 1.3  0.5 - 2.2 mmol/L  PRO B NATRIURETIC PEPTIDE      Result Value Range   Pro B Natriuretic peptide (BNP) 31248.0 (*) 0 - 450 pg/mL  TROPONIN I      Result Value Range   Troponin I 0.68 (*) <0.30 ng/mL  URINALYSIS, ROUTINE W REFLEX MICROSCOPIC      Result Value Range   Color, Urine YELLOW  YELLOW   APPearance CLEAR  CLEAR   Specific Gravity, Urine >1.030 (*) 1.005 - 1.030   pH 5.0  5.0 - 8.0   Glucose, UA NEGATIVE  NEGATIVE mg/dL   Hgb urine dipstick NEGATIVE  NEGATIVE   Bilirubin Urine NEGATIVE  NEGATIVE   Ketones, ur NEGATIVE  NEGATIVE mg/dL   Protein, ur 161 (*) NEGATIVE mg/dL   Urobilinogen, UA 0.2  0.0 - 1.0 mg/dL   Nitrite NEGATIVE  NEGATIVE   Leukocytes, UA NEGATIVE  NEGATIVE  COMPREHENSIVE METABOLIC PANEL      Result Value Range   Sodium 148 (*) 137 - 147 mEq/L   Potassium 2.8 (*) 3.7 - 5.3 mEq/L   Chloride 102  96 - 112 mEq/L   CO2 32  19 - 32 mEq/L   Glucose, Bld 154 (*) 70 - 99 mg/dL   BUN 45 (*) 6 - 23 mg/dL   Creatinine, Ser 4.09 (*) 0.50 - 1.10 mg/dL   Calcium 7.8 (*) 8.4 - 10.5 mg/dL   Total Protein 6.1  6.0 - 8.3 g/dL   Albumin 2.6 (*) 3.5 - 5.2 g/dL   AST 811 (*) 0 - 37 U/L   ALT 289 (*) 0 - 35 U/L   Alkaline Phosphatase 107  39 - 117 U/L   Total Bilirubin 0.5  0.3 - 1.2 mg/dL   GFR calc non Af Amer 24 (*) >90 mL/min   GFR calc Af Amer 28 (*) >90 mL/min  LIPASE, BLOOD      Result Value Range   Lipase 17  11 - 59 U/L  PROTIME-INR      Result Value Range   Prothrombin Time 16.3 (*) 11.6 - 15.2 seconds   INR 1.34  0.00 - 1.49  CBC WITH DIFFERENTIAL      Result Value Range   WBC 11.5 (*) 4.0 - 10.5 K/uL   RBC 3.11 (*) 3.87 - 5.11 MIL/uL   Hemoglobin 10.2 (*) 12.0 - 15.0 g/dL   HCT 91.4 (*) 78.2 - 95.6 %   MCV 105.8  (*) 78.0 - 100.0 fL   MCH 32.8  26.0 - 34.0 pg   MCHC 31.0  30.0 - 36.0 g/dL   RDW 21.3 (*) 08.6 - 57.8 %   Platelets 177  150 - 400 K/uL   Neutrophils Relative % 90 (*) 43 - 77 %   Neutro Abs 10.3 (*) 1.7 - 7.7 K/uL   Lymphocytes Relative 4 (*) 12 - 46 %   Lymphs Abs 0.5 (*) 0.7 - 4.0 K/uL   Monocytes Relative 6  3 - 12 %   Monocytes Absolute 0.7  0.1 - 1.0 K/uL   Eosinophils Relative 0  0 - 5 %   Eosinophils Absolute 0.0  0.0 - 0.7 K/uL   Basophils Relative 0  0 - 1 %   Basophils Absolute 0.0  0.0 - 0.1 K/uL  GLUCOSE, CAPILLARY      Result Value Range   Glucose-Capillary 139 (*) 70 - 99 mg/dL   Comment 1 Notify RN     Comment 2 Documented in Chart    URINE MICROSCOPIC-ADD ON      Result Value Range   Squamous Epithelial / LPF MANY (*) RARE   WBC, UA 0-2  <3 WBC/hpf   Bacteria, UA MANY (*) RARE   Casts HYALINE CASTS (*) NEGATIVE  TROPONIN I      Result Value Range   Troponin I 1.17 (*) <0.30 ng/mL    Imaging Review Ct Head Wo Contrast  03/28/2013   CLINICAL DATA:  Altered level of consciousness  EXAM: CT HEAD WITHOUT CONTRAST  TECHNIQUE: Contiguous axial images were obtained from the base of the skull through the vertex without intravenous contrast.  COMPARISON:  07/14/2010  FINDINGS: Mild cortical volume loss noted with proportional ventricular prominence. Areas of periventricular white matter hypodensity are most compatible with small vessel ischemic change. No acute hemorrhage, infarct, or mass lesion is identified. No midline shift. Left maxillary sinus air-fluid level is noted. Bilateral maxillary  mucoperiosteal thickening noted. No skull fracture. Orbits are unremarkable with evidence of prior probable right lens extraction.  IMPRESSION: Stable chronic findings as above, no acute intracranial finding.  Sinusitis with left maxillary air-fluid level.   Electronically Signed   By: Christiana Pellant M.D.   On: 04/24/13 19:01   Dg Chest Port 1 View  April 24, 2013   CLINICAL DATA:   Shortness of breath.  EXAM: PORTABLE CHEST - 1 VIEW  COMPARISON:  None.  FINDINGS: Patient is partially rotated to the left. Bilateral pleural effusions are seen, right side greater than left, with associated bibasilar atelectasis. Heart size is prominent. Pulmonary vascular congestion is seen, and mild interstitial edema cannot be excluded.  IMPRESSION: Bilateral pleural effusions and bibasilar atelectasis.  Pulmonary vascular congestion and possible mild interstitial edema.   Electronically Signed   By: Myles Rosenthal M.D.   On: 04-24-13 14:25    EKG Interpretation    Date/Time:  Friday 04-24-13 13:42:16 EST Ventricular Rate:  79 PR Interval:  132 QRS Duration: 90 QT Interval:  360 QTC Calculation: 412 R Axis:   -31 Text Interpretation:  Sinus rhythm with marked sinus arrhythmia with occasional and consecutive Premature ventricular complexes and Fusion complexes Left axis deviation ST \\T \ T wave abnormality, consider inferolateral ischemia Abnormal ECG When compared with ECG of 28-Sep-2007 19:24, Fusion complexes are now Present Premature ventricular complexes are now Present Right bundle branch block is no longer Present Confirmed by Markelle Asaro  MD, Brody Kump ([id number]) on April 24, 2013 2:28:04 PM          CRITICAL CARE Performed by: Shelda Jakes. Total critical care time: 60 Critical care time was exclusive of separately billable procedures and treating other patients. Critical care was necessary to treat or prevent imminent or life-threatening deterioration. Critical care was time spent personally by me on the following activities: development of treatment plan with patient and/or surrogate as well as nursing, discussions with consultants, evaluation of patient's response to treatment, examination of patient, obtaining history from patient or surrogate, ordering and performing treatments and interventions, ordering and review of laboratory studies, ordering and review of radiographic  studies, pulse oximetry and re-evaluation of patient's condition.   MDM   1. Hypoxia   2. Hypernatremia   3. Hypokalemia   4. Pleural effusion, bilateral   5. Decubitus skin ulcer   6. Non-STEMI (non-ST elevated myocardial infarction)    Patient arrived in respiratory distress. Family reported fever and shortness of breath today. Patient with a history of dementia. Patient on her percent nonrebreather on presentation was satting below 90%. Patient started on BiPAP with significant improvement. Chest x-ray shows bilateral pleural effusions with suggestive of some mild perhaps pulmonary edema. Patient was given Lasix 40 mg. Electrolytes came back which showed that she was hypernatremic and hypokalemic. Patient received 10 more likely to potassium IV x2. Patient given a fluid bolus. Patient's systolic blood pressures have been low 100s occasionally a little below 90 but mostly 89-112. Patient's lactic acid was negative not consistent with sepsis. Patient does have a decubitus ulcer of the left buttocks hip area. Mild leukocytosis at 11.5. Patient's initial troponin was elevated repeat troponin was elevated consistent with a non-STEMI basic EKG EKG did show some perhaps inferior lateral ischemia. Change compared to 2009. Patient will require transfer to [facility]. Will initially speak with critical care and cardiology. Patient significantly improved on the BiPAP much more comfortable good color sats are consistently above 90%. Patient does have a marked elevated BNP so certainly  has propensity to pulmonary edema. Suspect that the main problem may very well have been the myocardial infarction. Head CT negative was waiting to get that back before starting heparin since it is negative we'll start the patient on heparin.    Shelda Jakes, MD 2013/04/25 [facility] [place]   Addendum:  On arrival of a [facility] further discussion with critical care the on-call critical care recommended intubation for transport. Patient's  blood gases is same as when the transport was initially arranged. That critical care specialist Dr. Kathrin Penner I did not recommend intubation. However more than willing to accommodate. Patient intubated without difficulties using the glide a scope. In addition [facility] transport team wanted the levo fed IV available that was ordered as well.  See intubation procedure note.  Shelda Jakes, MD 04/25/2013 2223

## 2013-04-13 NOTE — ED Notes (Signed)
Pt is responsive to painful stimuli only, non-verbal.  sats in mid 70s on 2L O2, placed pt on 15L NRB and sats up to upper 80s.  edp at beside for further.

## 2013-04-13 NOTE — ED Notes (Signed)
CRITICAL VALUE ALERT  Critical value received:  K+ 2.8; Troponin 0.68  Date of notification:  04/20/2013  Time of notification:  1338  Critical value read back:yes  Nurse who received alert:  Santiago BurElizabeth Tanza Pellot, RN  MD notified (1st page):    Time of first page:    MD notified (2nd page):  Time of second page:  Responding MD:  Dr. Deretha EmoryZackowski  Time MD responded:  [phone]

## 2013-04-13 NOTE — H&P (Addendum)
PULMONARY  / CRITICAL CARE MEDICINE HISTORY AND PHYSICAL EXAMINATION  Name: [name] MRN: [id number] DOB: [birth year]    ADMISSION DATE:  04/12/2013  CHIEF COMPLAINT:  Acute respiratory failure  BRIEF PATIENT DESCRIPTION: 80 yo F with multiple organ dysfunction possibly secondary to either a primary cardiac event or sepsis who was transferred from [facility]. On transfer goals of care conversation held with primary care giver and decision made not to escalate care and withdraw care in the morning once the family has gathered.    SIGNIFICANT EVENTS / STUDIES:  1. Intubated at [facility] Secondary to Worsening Hypoxemia Apr 25, 2013  LINES / TUBES: 1. PIV  CULTURES: 1. None  ANTIBIOTICS: 1. None  HISTORY OF PRESENT ILLNESS:  [name] is an 80 yo F with dementia, severe carotid stenosis and prior CVAs who was transferred from [facility] after being brought to the ED by her primary care giver, her daughter Nehemiah Massed [phone]), for altered mental status. The following is obtained from my phone conversation with Bonita Quin as the patient is unable to provide any history. Her daughter notes that she has been essentially bedbowned for the last 3 months. Over the last several days she has refused all food and been less communicative. Her daughter notes that her large L thigh ulcer is managed by a home health company.   Her daughter notes that [name] watched one of her children pass away several years ago and notes that her mother mentioned that she would not want to live like that. [name] notes that she knows that her mother's dementia is unlikely to get better and that her multiple medical issues portend a terrible prognosis.  PAST MEDICAL HISTORY :  Past Medical History  Diagnosis Date  . Dementia   . Hypertension   . High cholesterol   . CKD (chronic kidney disease)   . Osteopenia   . CVA (cerebral infarction)     History reviewed. No pertinent past surgical  history.  Prior to Admission medications   Medication Sig Start Date End Date Taking? Authorizing Provider  alendronate (FOSAMAX) 70 MG tablet Take 70 mg by mouth once a week. Take with a full glass of water on an empty stomach on Saturday   Yes Historical Provider, MD  calcium-vitamin D (OSCAL) 250-125 MG-UNIT per tablet Take 1 tablet by mouth 2 (two) times daily.   Yes Historical Provider, MD  Cholecalciferol (VITAMIN D-3 PO) Take 1 tablet by mouth 2 (two) times daily.   Yes Historical Provider, MD  clopidogrel (PLAVIX) 75 MG tablet Take 75 mg by mouth daily with breakfast.   Yes Historical Provider, MD  LORazepam (ATIVAN) 1 MG tablet Take 1 mg by mouth every 4 (four) hours as needed for anxiety.   Yes Historical Provider, MD  lovastatin (MEVACOR) 40 MG tablet Take 40 mg by mouth at bedtime.   Yes Historical Provider, MD  niacin 500 MG tablet Take 500 mg by mouth daily.   Yes Historical Provider, MD  sodium polystyrene (KAYEXALATE) 15 GM/60ML suspension Take 15 g by mouth 2 (two) times a week. On Mondays and Thursdays   Yes Historical Provider, MD  vitamin E 400 UNIT capsule Take 400 Units by mouth 2 (two) times daily.   Yes Historical Provider, MD  zolpidem (AMBIEN) 5 MG tablet Take 5 mg by mouth at bedtime as needed for sleep.   Yes Historical Provider, MD    No Known Allergies  FAMILY HISTORY:  No family  history on file.  SOCIAL HISTORY:  reports that she does not drink alcohol or use illicit drugs. Her tobacco history is not on file.  REVIEW OF SYSTEMS:  Unable to obtain secondary to patient condition.  PHYSICAL EXAM  VITAL SIGNS: Temp:  [98.9 F (37.2 C)-100.3 F (37.9 C)] 98.9 F (37.2 C) (01/23 2142) Pulse Rate:  [29-93] 72 (01/24 0016) Resp:  [14-32] 16 (01/24 0016) BP: (60-138)/(22-113) 91/58 mmHg (01/24 0016) SpO2:  [88 %-99 %] 88 % (01/24 0016) FiO2 (%):  [60 %-100 %] 60 % (01/24 0016) Weight:  [100 lb (45.36 kg)-105 lb 9.6 oz (47.9 kg)] 105 lb 9.6 oz (47.9 kg)  (01/24 0016)  HEMODYNAMICS:    VENTILATOR SETTINGS: Vent Mode:  [-] PRVC FiO2 (%):  [60 %-100 %] 60 % Set Rate:  [16 bmp] 16 bmp Vt Set:  [420 mL] 420 mL PEEP:  [5 cmH20] 5 cmH20 Plateau Pressure:  [14 cmH20] 14 cmH20  INTAKE / OUTPUT: Intake/Output     01/23 0701 - 01/24 0700   I.V. (mL/kg) 40 (0.8)   Total Intake(mL/kg) 40 (0.8)   Urine (mL/kg/hr) 250   Total Output 250   Net -210         PHYSICAL EXAMINATION: General:  Elderly F with profoundly malodorous L thigh ulcer  Neuro:  Unable to assess HEENT:  L corneal scarring, erythemaous eyes, unable to assess mouth Neck:  Trachea supple and midline' no obvious JVD  Cardiovascular:  RRR, NS1/S2, (-) MRG Lungs:  Coarse mechanical breath sounds bilaterally Abdomen:  S/NT/ND/(+)BS Musculoskeletal:  3+ edema to thighs and in RUE to elbow Skin:  Stage 4, 10 cm round pressure ulcer on L thigh, stage 2 decubitous ulcer ~ 2 cm in diameter  LABS:  CBC Recent Labs     03/28/2013  1413  WBC  11.5*  HGB  10.2*  HCT  32.9*  PLT  177    Coag's Recent Labs     04/06/2013  1413  INR  1.34    BMET Recent Labs     04/19/2013  1413  NA  148*  K  2.8*  CL  102  CO2  32  BUN  45*  CREATININE  1.82*  GLUCOSE  154*    Electrolytes Recent Labs     03/30/2013  1413  CALCIUM  7.8*    Sepsis Markers No results found for this basename: LACTICACIDVEN, PROCALCITON, O2SATVEN,  in the last 72 hours  ABG Recent Labs     04/12/2013  1430  04/12/2013  2055  PHART  7.396  7.406  PCO2ART  52.7*  52.0*  PO2ART  75.2*  83.2    Liver Enzymes Recent Labs     04/04/2013  1413  AST  589*  ALT  289*  ALKPHOS  107  BILITOT  0.5  ALBUMIN  2.6*    Cardiac Enzymes Recent Labs     04/20/2013  1413  04/16/2013  1825  TROPONINI  0.68*  1.17*  PROBNP  31248.0*   --     Glucose Recent Labs     04/09/2013  1348  GLUCAP  139*    Imaging Ct Head Wo Contrast  03/28/2013   CLINICAL DATA:  Altered level of consciousness  EXAM: CT  HEAD WITHOUT CONTRAST  TECHNIQUE: Contiguous axial images were obtained from the base of the skull through the vertex without intravenous contrast.  COMPARISON:  07/14/2010  FINDINGS: Mild cortical volume loss noted with proportional ventricular prominence. Areas of  periventricular white matter hypodensity are most compatible with small vessel ischemic change. No acute hemorrhage, infarct, or mass lesion is identified. No midline shift. Left maxillary sinus air-fluid level is noted. Bilateral maxillary mucoperiosteal thickening noted. No skull fracture. Orbits are unremarkable with evidence of prior probable right lens extraction.  IMPRESSION: Stable chronic findings as above, no acute intracranial finding.  Sinusitis with left maxillary air-fluid level.   Electronically Signed   By: Christiana Pellant M.D.   On: 04/02/2013 19:01   Dg Chest Portable 1 View     CLINICAL DATA:  Endotracheal tube placement.  EXAM: PORTABLE CHEST - 1 VIEW  COMPARISON:  Chest radiograph performed earlier today at 2:18 p.m.  FINDINGS: The patient's endotracheal tube is seen ending 3 cm above the carina.  A moderate right-sided pleural effusion is noted, perhaps mildly increased from the prior study. A small left pleural effusion is seen. Underlying vascular congestion is noted. Increased interstitial markings may reflect mild interstitial edema. No pneumothorax is seen.  The cardiomediastinal silhouette is borderline enlarged. No acute osseous abnormalities are identified.  IMPRESSION: 1. Endotracheal tube seen ending 3 cm above the carina. 2. Moderate right-sided pleural effusion may be mildly increased from the prior study. Small left pleural effusion seen. Underlying vascular congestion and borderline cardiomegaly noted. Increased interstitial markings may reflect interstitial edema.   Electronically Signed   By: Roanna Raider M.D.   On: 04/05/2013 22:57   Dg Chest Port 1 View  04/16/2013   CLINICAL DATA:  Shortness of  breath.  EXAM: PORTABLE CHEST - 1 VIEW  COMPARISON:  None.  FINDINGS: Patient is partially rotated to the left. Bilateral pleural effusions are seen, right side greater than left, with associated bibasilar atelectasis. Heart size is prominent. Pulmonary vascular congestion is seen, and mild interstitial edema cannot be excluded.  IMPRESSION: Bilateral pleural effusions and bibasilar atelectasis.  Pulmonary vascular congestion and possible mild interstitial edema.   Electronically Signed   By: Myles Rosenthal M.D.   On: 03/25/2013 14:25   EKG: Personally reviewed. Lateral TWI, PVC. CXR: Personally reviewed. R effusion. Edema. Cardiomegaly.  ASSESSMENT / PLAN: Principal Problem:   Septic shock Active Problems:   Non-STEMI (non-ST elevated myocardial infarction)   Shock liver   Acute combined systolic and diastolic congestive heart failure   Acute respiratory failure   Acute on chronic renal failure   Hypokalemia   Hypernatremia   Pressure ulcer stage IV   Altered mental status   Anemia   Hyperglycemia  1. Goals of Care: [name] daughter, Nehemiah Massed [phone]), indicated that her mother would not want to aggressive management give the multiple organ dysfunction and terrible underlying dementia. As such, we decided to together that the best course of action was make to not stop the interventions keeping her alive for now, specifically mechanical ventilation and pressors, but that we will not titrate any of the therapies and that no labs will be drawn nor will central lines be placed. We will instead focus on her comfort. [name] will be DNR. The family will gather in the morning and they will withdraw care in the morning. Nurse Melina Fiddler confirmed the content of my conversation with [name].   TODAY'S SUMMARY:   I have personally obtained a history, examined the patient, evaluated laboratory and imaging results, formulated the assessment and plan and placed orders.  CRITICAL CARE:  The patient is critically ill with multiple organ systems failure and requires high complexity decision making for assessment  and support, frequent evaluation and titration of therapies, application of advanced monitoring technologies and extensive interpretation of multiple databases. Critical Care Time devoted to patient care services described in this note is 60 minutes.   Evalyn Casco, MD Pulmonary and Critical Care Medicine [facility] Pager: [phone]  04/22/2013, 12:41 AM

## 2013-04-13 NOTE — Progress Notes (Signed)
ANTICOAGULATION CONSULT NOTE - Initial Consult  Pharmacy Consult for Heparin Indication: chest pain/ACS  No Known Allergies  Patient Measurements: Height: 5\' 2"  (157.5 cm) Weight: 100 lb (45.36 kg) IBW/kg (Calculated) : 50.1 Heparin Dosing Weight: 45.4 kg  Vital Signs: Temp: 99.6 F (37.6 C) (01/23 1900) Temp src: Rectal (01/23 1249) BP: 104/60 mmHg (01/23 1900) Pulse Rate: 73 (01/23 1900)  Labs:  Recent Labs  04/08/2013 1413 03/28/2013 1825  HGB 10.2*  --   HCT 32.9*  --   PLT 177  --   LABPROT 16.3*  --   INR 1.34  --   CREATININE 1.82*  --   TROPONINI 0.68* 1.17*    Estimated Creatinine Clearance: 16.5 ml/min (by C-G formula based on Cr of 1.82).   Medical History: Past Medical History  Diagnosis Date  . Dementia   . Renal disorder   . Hypertension   . High cholesterol     Medications:  Scheduled:    Assessment: Patient arrived in respiratory distress Heparin for ACS/Stemi Heparin 4000 units bolus given in ED already Heparin 650 units/hour started in ED already Reviewed labs and PTA medications Patient to be transferred to [facility]  Goal of Therapy:  Heparin level 0.3-0.7 units/ml Monitor platelets by anticoagulation protocol: Yes   Plan:  Reduced heparin rate to 550 units/hour (12 units/kg/hour) Unfractionated heparin level in 8 hours and daily Labs per protocol  Raquel JamesPittman, Lindsay Straka Bennett 03/26/2013,7:26 PM

## 2013-04-14 ENCOUNTER — Encounter (HOSPITAL_COMMUNITY): Payer: Self-pay | Admitting: Internal Medicine

## 2013-04-14 DIAGNOSIS — J9600 Acute respiratory failure, unspecified whether with hypoxia or hypercapnia: Secondary | ICD-10-CM

## 2013-04-14 DIAGNOSIS — N189 Chronic kidney disease, unspecified: Secondary | ICD-10-CM | POA: Diagnosis present

## 2013-04-14 DIAGNOSIS — R739 Hyperglycemia, unspecified: Secondary | ICD-10-CM | POA: Diagnosis present

## 2013-04-14 DIAGNOSIS — L8994 Pressure ulcer of unspecified site, stage 4: Secondary | ICD-10-CM | POA: Diagnosis present

## 2013-04-14 DIAGNOSIS — I509 Heart failure, unspecified: Secondary | ICD-10-CM

## 2013-04-14 DIAGNOSIS — D649 Anemia, unspecified: Secondary | ICD-10-CM | POA: Diagnosis present

## 2013-04-14 DIAGNOSIS — E876 Hypokalemia: Secondary | ICD-10-CM | POA: Diagnosis present

## 2013-04-14 DIAGNOSIS — R4182 Altered mental status, unspecified: Secondary | ICD-10-CM | POA: Diagnosis present

## 2013-04-14 DIAGNOSIS — I5041 Acute combined systolic (congestive) and diastolic (congestive) heart failure: Secondary | ICD-10-CM | POA: Diagnosis present

## 2013-04-14 DIAGNOSIS — E870 Hyperosmolality and hypernatremia: Secondary | ICD-10-CM | POA: Diagnosis present

## 2013-04-14 DIAGNOSIS — K7200 Acute and subacute hepatic failure without coma: Secondary | ICD-10-CM | POA: Diagnosis present

## 2013-04-14 DIAGNOSIS — J9690 Respiratory failure, unspecified, unspecified whether with hypoxia or hypercapnia: Secondary | ICD-10-CM | POA: Diagnosis present

## 2013-04-14 DIAGNOSIS — R0902 Hypoxemia: Secondary | ICD-10-CM

## 2013-04-14 DIAGNOSIS — N179 Acute kidney failure, unspecified: Secondary | ICD-10-CM

## 2013-04-14 DIAGNOSIS — A419 Sepsis, unspecified organism: Secondary | ICD-10-CM | POA: Diagnosis present

## 2013-04-14 DIAGNOSIS — R6521 Severe sepsis with septic shock: Secondary | ICD-10-CM

## 2013-04-14 LAB — MRSA PCR SCREENING: MRSA by PCR: NEGATIVE

## 2013-04-14 MED ORDER — MORPHINE BOLUS VIA INFUSION
5.0000 mg | INTRAVENOUS | Status: DC | PRN
  Filled 2013-04-14: qty 20

## 2013-04-14 MED ORDER — DEXTROSE 5 % IV SOLN
10.0000 mg/h | INTRAVENOUS | Status: DC
  Administered 2013-04-14: 10 mg/h via INTRAVENOUS
  Filled 2013-04-14: qty 10

## 2013-04-15 LAB — URINE CULTURE
COLONY COUNT: NO GROWTH
Culture: NO GROWTH

## 2013-04-16 LAB — CULTURE, BLOOD (ROUTINE X 2)

## 2013-04-18 LAB — CULTURE, BLOOD (ROUTINE X 2): Culture: NO GROWTH

## 2013-04-19 NOTE — Discharge Summary (Signed)
[facility] [place]:  Kasson, Bina                ACCOUNT NO.:  [id number]  MEDICAL RECORD NO.:  [id number]  LOCATION:  2H01C                        FACILITY:  [facility]  PHYSICIAN:  Nelda Bucksaniel J Xena Propst, MD DATE OF BIRTH:  Aug 07, 1926  DATE OF ADMISSION:  03/26/2013 DATE OF DISCHARGE:  03/28/2013                              DISCHARGE SUMMARY   DEATH SUMMARY  This is an 80 year old unfortunate female with multiple organ dysfunction possibly secondary to either primary cardiac event or sepsis.  She was [facility] from the [facility].  [facility], there was goals of care conversation had with this elderly female with dementia and family decided clearly did not want aggressive heroic measures and want to consider comfort care in the morning time.  The patient's hospital course was significant for requiring intubation secondary to worsening hypoxemia on March 14, 2014.  She had secondary diagnosis of septic shock, non-ST segment elevation myocardial infarction, acute-on-chronic renal failure, shock liver, acute combined systolic and diastolic heart failure, acute respiratory failure, altered mental status, anemia, and electrolyte disturbances with a pressure ulcer stage IV likely concerning for being actively infected.  Family arrived and understood that we had maximized her efforts with Levophed and other pressors and the patient would not have wanted aggressive heroic measures given her underlying poor functional status and comfort care was initiated. Discontinuation of life support and the patient expired.  FINAL DIAGNOSES UPON DEATH: 1. Septic shock, rule out decubitus ulcer as a source. 2. Non-ST segment elevation myocardial infarction. 3. Multiorgan dysfunction syndrome including shock liver. 4. Acute-on-chronic renal failure. 5. Acute respiratory failure. 6. Altered mental status likely septic encephalopathy.     Nelda Bucksaniel J Bianna Haran,  MD     DJF/MEDQ  D:  04/18/2013  T:  04/19/2013  Job:  [id number]

## 2013-04-22 NOTE — Progress Notes (Signed)
Nutrition Brief Note  Chart reviewed. Plans for comfort care with withdrawal of care when family arrives. No nutrition interventions warranted at this time. Please consult RD as needed.   Joaquin CourtsKimberly Harris, RD, LDN, CNSC Pager [phone] After Hours Pager [phone]

## 2013-04-22 NOTE — Progress Notes (Signed)
No wean due to primary care giver making decision not to escalate care and possibly withdrawal of care today.

## 2013-04-22 NOTE — Progress Notes (Signed)
PULMONARY  / CRITICAL CARE MEDICINE HISTORY AND PHYSICAL EXAMINATION  Name: [name] MRN: [id number] DOB: 03/23/1926    ADMISSION DATE:  04/17/2013  CHIEF COMPLAINT:  Acute respiratory failure  BRIEF PATIENT DESCRIPTION: 80 yo F with multiple organ dysfunction possibly secondary to either a primary cardiac event or sepsis who was transferred from [facility]. On transfer goals of care conversation held with primary care giver and decision made not to escalate care and withdraw care in the morning once the family has gathered.    SIGNIFICANT EVENTS / STUDIES:  1. [facility] to Worsening Hypoxemia 04/20/2013  LINES / TUBES: 1. PIV  CULTURES: 1. None  ANTIBIOTICS: 1. None  Subjective: remains on levo  PHYSICAL EXAM  VITAL SIGNS: Temp:  [98.7 F (37.1 C)-100.3 F (37.9 C)] 98.7 F (37.1 C) (01/24 0400) Pulse Rate:  [29-93] 87 (01/24 0738) Resp:  [14-32] 16 (01/24 0500) BP: (60-143)/(22-113) 133/25 mmHg (01/24 0738) SpO2:  [88 %-99 %] 96 % (01/24 0738) FiO2 (%):  [60 %-100 %] 60 % (01/24 0738) Weight:  [45.36 kg (100 lb)-47.9 kg (105 lb 9.6 oz)] 47.9 kg (105 lb 9.6 oz) (01/24 0016)  HEMODYNAMICS:    VENTILATOR SETTINGS: Vent Mode:  [-] PRVC FiO2 (%):  [60 %-100 %] 60 % Set Rate:  [16 bmp] 16 bmp Vt Set:  [420 mL] 420 mL PEEP:  [5 cmH20] 5 cmH20 Plateau Pressure:  [14 cmH20-24 cmH20] 23 cmH20  INTAKE / OUTPUT: Intake/Output     01/23 0701 - 01/24 0700 01/24 0701 - 01/25 0700   I.V. (mL/kg) 363.9 (7.6)    Total Intake(mL/kg) 363.9 (7.6)    Urine (mL/kg/hr) 250    Total Output 250     Net +113.9            PHYSICAL EXAMINATION: General:  Elderly F with profoundly malodorous L thigh ulcer large Neuro:  rass -3 HEENT:  L corneal scarring, erythemaous eyes Neck:  Trachea supple Cardiovascular:  RRR, NS1/S2, (-) MRG Lungs:  Coarse throughout Abdomen:  S/NT/ND/(+)BS Musculoskeletal:  3+ edema to thighs and in RUE to  elbow Skin:  Stage 4, 10 cm round pressure ulcer on L thigh, stage 2 decubitous ulcer ~ 2 cm in diameter  LABS:  CBC Recent Labs     04/16/2013  1413  WBC  11.5*  HGB  10.2*  HCT  32.9*  PLT  177    Coag's Recent Labs     04/08/2013  1413  INR  1.34    BMET Recent Labs     04/20/2013  1413  NA  148*  K  2.8*  CL  102  CO2  32  BUN  45*  CREATININE  1.82*  GLUCOSE  154*    Electrolytes Recent Labs     04/02/2013  1413  CALCIUM  7.8*    Sepsis Markers No results found for this basename: LACTICACIDVEN, PROCALCITON, O2SATVEN,  in the last 72 hours  ABG Recent Labs     04/21/2013  1430  04/03/2013  2055  PHART  7.396  7.406  PCO2ART  52.7*  52.0*  PO2ART  75.2*  83.2    Liver Enzymes Recent Labs     03/30/2013  1413  AST  589*  ALT  289*  ALKPHOS  107  BILITOT  0.5  ALBUMIN  2.6*    Cardiac Enzymes Recent Labs     04/10/2013  1413  04/18/2013  1825  TROPONINI  0.68*  1.17*  PROBNP  31248.0*   --     Glucose Recent Labs     04/24/13  1348  GLUCAP  139*    Imaging Ct Head Wo Contrast  April 24, 2013   CLINICAL DATA:  Altered level of consciousness  EXAM: CT HEAD WITHOUT CONTRAST  TECHNIQUE: Contiguous axial images were obtained from the base of the skull through the vertex without intravenous contrast.  COMPARISON:  07/14/2010  FINDINGS: Mild cortical volume loss noted with proportional ventricular prominence. Areas of periventricular white matter hypodensity are most compatible with small vessel ischemic change. No acute hemorrhage, infarct, or mass lesion is identified. No midline shift. Left maxillary sinus air-fluid level is noted. Bilateral maxillary mucoperiosteal thickening noted. No skull fracture. Orbits are unremarkable with evidence of prior probable right lens extraction.  IMPRESSION: Stable chronic findings as above, no acute intracranial finding.  Sinusitis with left maxillary air-fluid level.   Electronically Signed   By: Christiana Pellant M.D.    On: 04/24/13 19:01   Dg Chest Portable 1 View  April 24, 2013   CLINICAL DATA:  Endotracheal tube placement.  EXAM: PORTABLE CHEST - 1 VIEW  COMPARISON:  Chest radiograph performed earlier today at 2:18 p.m.  FINDINGS: The patient's endotracheal tube is seen ending 3 cm above the carina.  A moderate right-sided pleural effusion is noted, perhaps mildly increased from the prior study. A small left pleural effusion is seen. Underlying vascular congestion is noted. Increased interstitial markings may reflect mild interstitial edema. No pneumothorax is seen.  The cardiomediastinal silhouette is borderline enlarged. No acute osseous abnormalities are identified.  IMPRESSION: 1. Endotracheal tube seen ending 3 cm above the carina. 2. Moderate right-sided pleural effusion may be mildly increased from the prior study. Small left pleural effusion seen. Underlying vascular congestion and borderline cardiomegaly noted. Increased interstitial markings may reflect interstitial edema.   Electronically Signed   By: Roanna Raider M.D.   On: 2013-04-24 22:57   Dg Chest Port 1 View  2013/04/24   CLINICAL DATA:  Shortness of breath.  EXAM: PORTABLE CHEST - 1 VIEW  COMPARISON:  None.  FINDINGS: Patient is partially rotated to the left. Bilateral pleural effusions are seen, right side greater than left, with associated bibasilar atelectasis. Heart size is prominent. Pulmonary vascular congestion is seen, and mild interstitial edema cannot be excluded.  IMPRESSION: Bilateral pleural effusions and bibasilar atelectasis.  Pulmonary vascular congestion and possible mild interstitial edema.   Electronically Signed   By: Myles Rosenthal M.D.   On: 04/24/2013 14:25   EKG: Personally reviewed. Lateral TWI, PVC.  ASSESSMENT / PLAN: Principal Problem:   Septic shock Active Problems:   Non-STEMI (non-ST elevated myocardial infarction)   Acute on chronic renal failure   Shock liver   Acute combined systolic and diastolic congestive  heart failure   Acute respiratory failure   Altered mental status   Anemia   Hypokalemia   Hypernatremia   Hyperglycemia   Pressure ulcer stage IV   Respiratory failure  PLAN:  Maintain current MV, abg reviewed I have reviewed pcxr and ct head Remains on levophed, maintain to map goal 60, no escalation, dc this once ETT taken out at same time No role repeat left Vent reviewed, will assess on cpap 5 ps 5 1 min to assess morphine needs for Likely comfort care extubation Surgical  Intervention would be medically ineffective and NOT in line with family wishes Would prefer to start morphine now and ensure comfort Will update family  upon arrival on process of comfort  I have personally obtained a history, examined the patient, evaluated laboratory and imaging results, formulated the assessment and plan and placed orders.  CRITICAL CARE: The patient is critically ill with multiple organ systems failure and requires high complexity decision making for assessment and support, frequent evaluation and titration of therapies, application of advanced monitoring technologies and extensive interpretation of multiple databases. Critical Care Time devoted to patient care services described in this note is 30 minutes.   Mcarthur Rossetti. Tyson Alias, MD, FACP Pgr: [phone]  Pulmonary & Critical Care

## 2013-04-22 NOTE — Progress Notes (Signed)
Pt pronounced dead at 1110 by myself and Carlton AdamSarah Herbert ,Rn  After 1 minute of auscultating heart sounds and were absent. Family at bedside. Belongings sent home with patient.  Tammy SoursAngela Viola Kinnick

## 2013-04-22 NOTE — Procedures (Signed)
Extubation Procedure Note  Patient Details:   Name: Kieth Brightlyancy A Primo DOB: 23-Dec-1926 MRN: [id number]   Airway Documentation:     Evaluation  O2 sats: currently acceptable Complications: No apparent complications Patient did tolerate procedure well. Bilateral Breath Sounds: Rhonchi   No  Pt was extubated to 2 LPM [place]. No productive cough. No vocalizing. Pt HR 66 RR 12. Family at bedside.   Leahmarie Gasiorowski M 04/16/2013, 10:49 AM

## 2013-04-22 NOTE — Progress Notes (Signed)
ABG was not obtained on current vent settings per CCM due to pt. Being a DNR & comfort care.

## 2013-04-22 DEATH — deceased

## 2015-06-01 IMAGING — CT CT HEAD W/O CM
1 series · 16 of 30 positions shown, 20 images · non-contrast
Comparison: 07/14/2010

CLINICAL DATA: Altered level of consciousness

EXAM:
CT HEAD WITHOUT CONTRAST
TECHNIQUE: Contiguous axial images were obtained from the base of the skull
through the vertex without intravenous contrast.

[Series 2: headtrauma 4.8 h37s · axial · 0.43mm/px · z∈[+69,+229]mm · 16 of 36 slices shown, 20 images]
[im 2/36  brain]
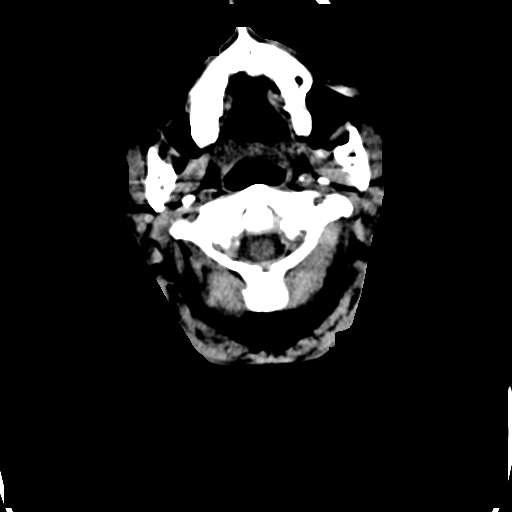
[im 2/36  bone]
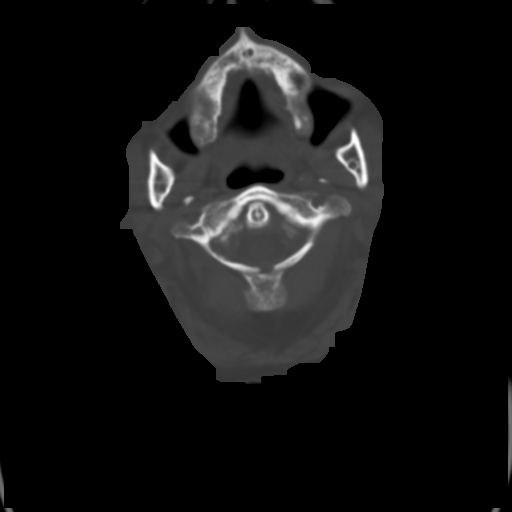
[im 4/36  brain]
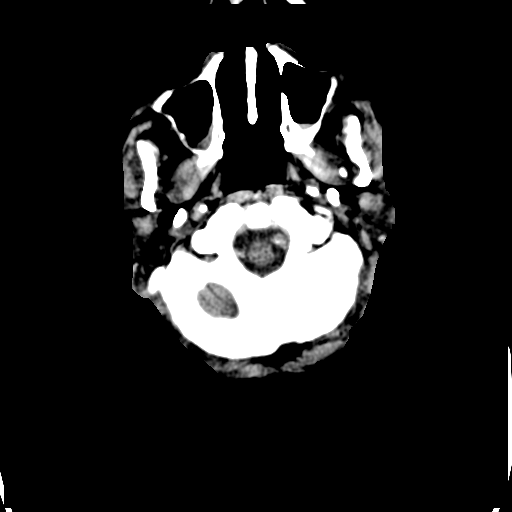
[im 7/36  brain]
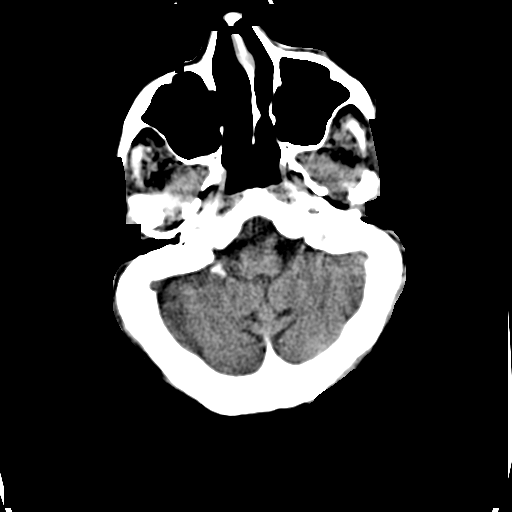
[im 9/36  brain]
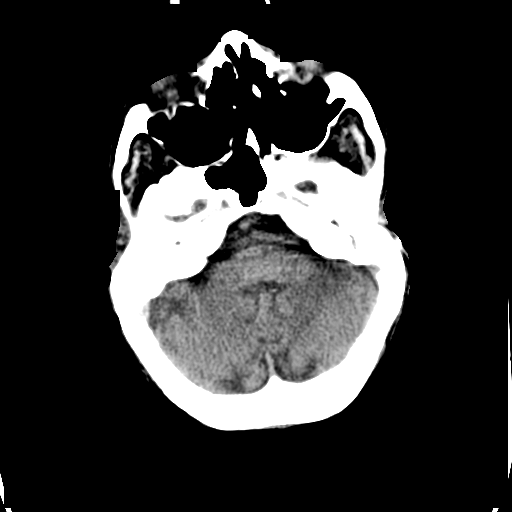
[im 10/36  brain]
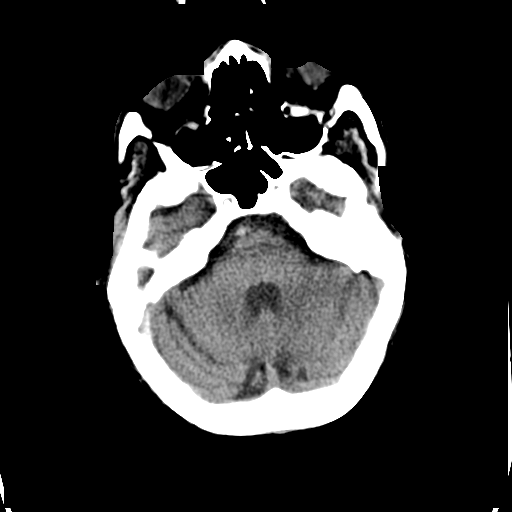
[im 10/36  bone]
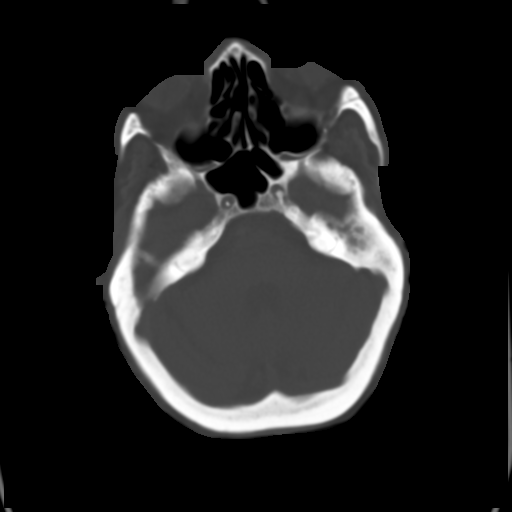
[im 13/36  brain]
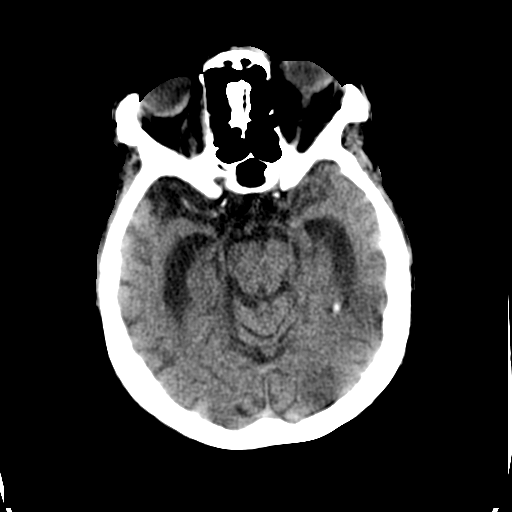
[im 15/36  brain]
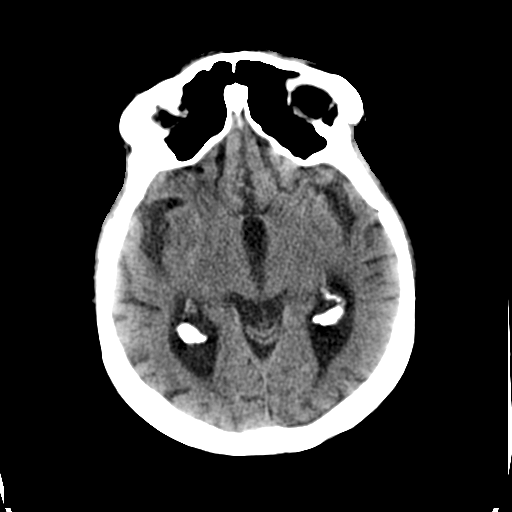
[im 17/36  brain]
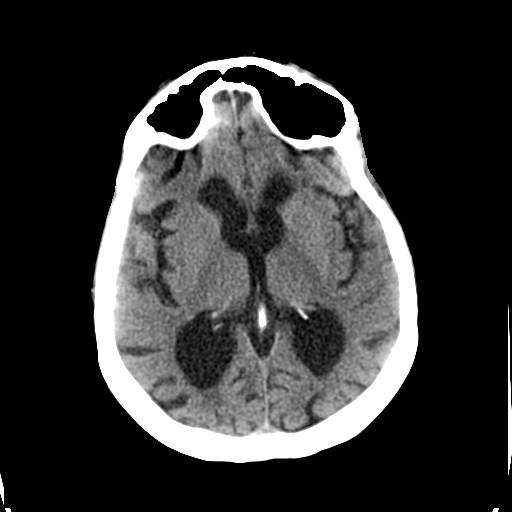
[im 19/36  brain]
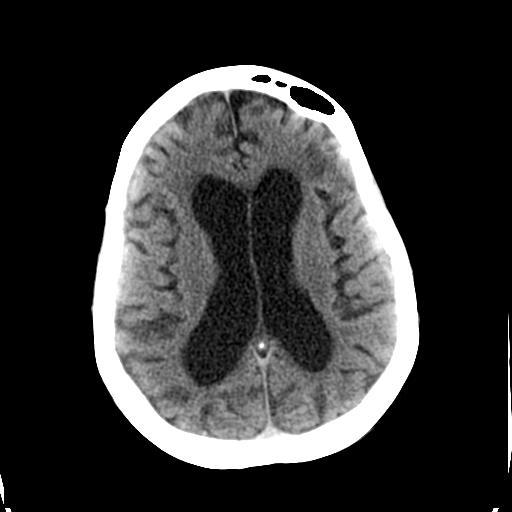
[im 19/36  bone]
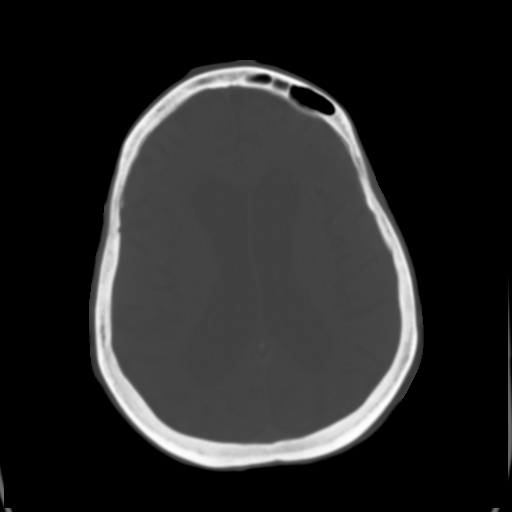
[im 21/36  brain]
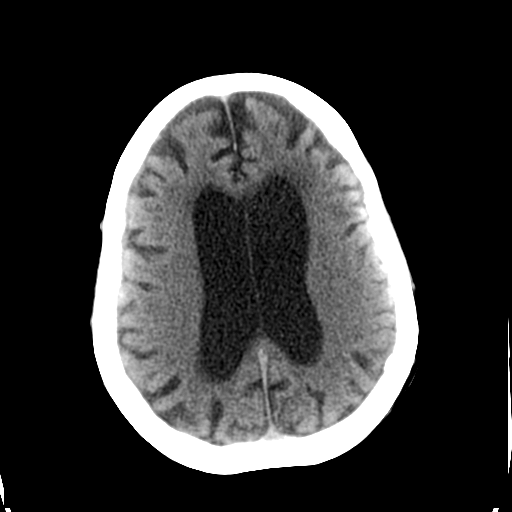
[im 23/36  brain]
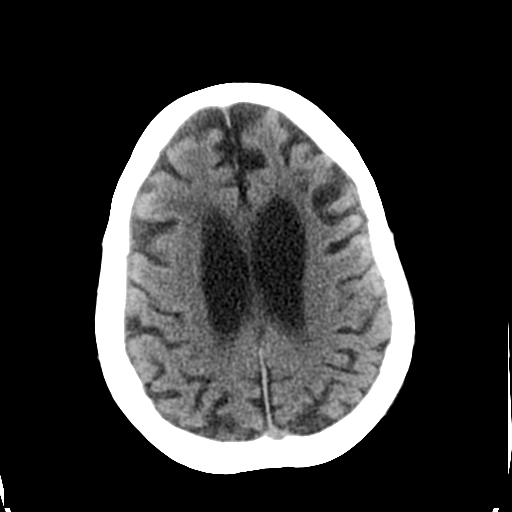
[im 26/36  brain]
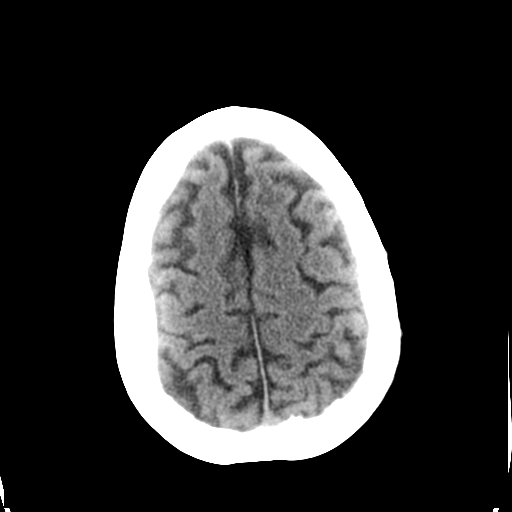
[im 27/36  brain]
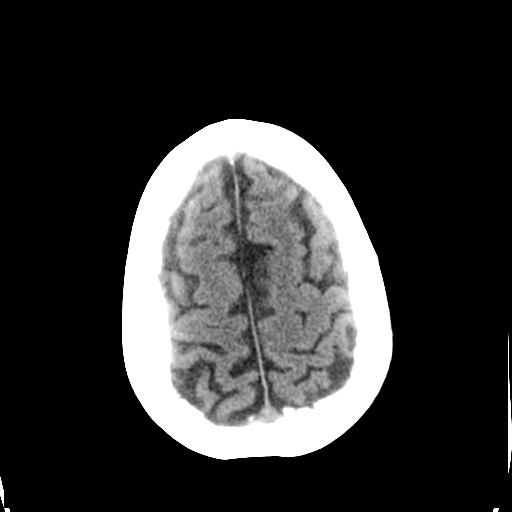
[im 27/36  bone]
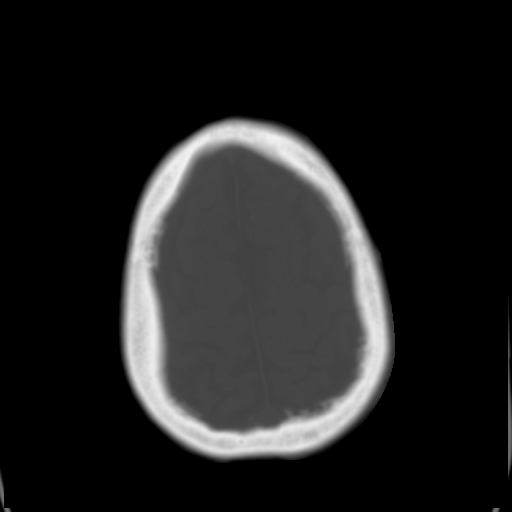
[im 29/36  brain]
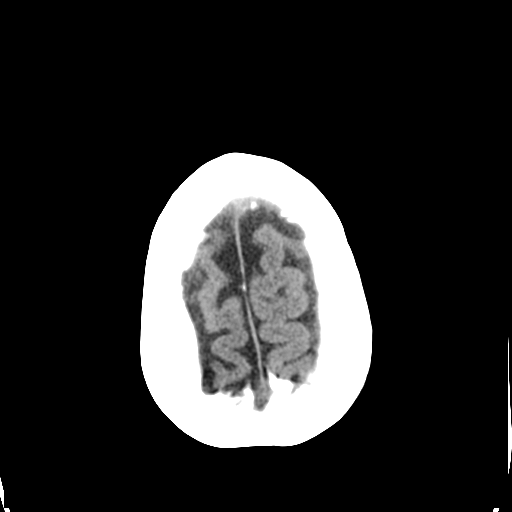
[im 32/36  brain]
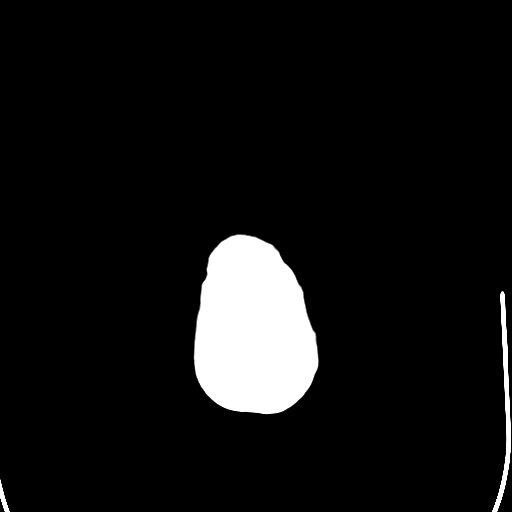
[im 34/36  brain]
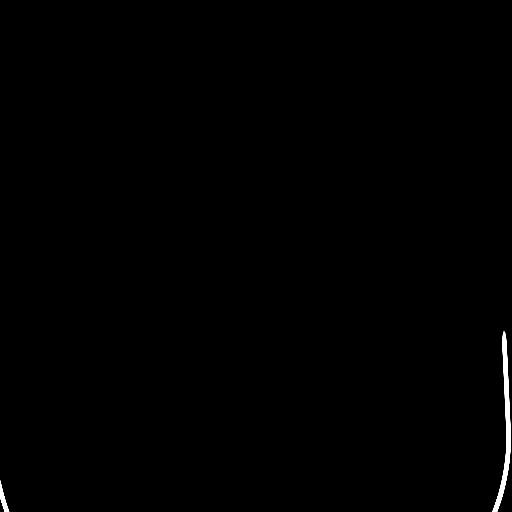

[16 of 30 positions shown; findings below may reference images not displayed]

FINDINGS: Mild cortical volume loss noted with proportional ventricular
prominence. Areas of periventricular white matter hypodensity are
most compatible with small vessel ischemic change. No acute
hemorrhage, infarct, or mass lesion is identified. No midline shift.
Left maxillary sinus air-fluid level is noted. Bilateral maxillary
mucoperiosteal thickening noted. No skull fracture. Orbits are
unremarkable with evidence of prior probable right lens extraction.
IMPRESSION: Stable chronic findings as above, no acute intracranial finding.

Sinusitis with left maxillary air-fluid level.
